# Patient Record
Sex: Female | Born: 1945 | Race: White | Hispanic: No | Marital: Married | Smoking: Never smoker
Health system: Southern US, Community
[De-identification: ages and names within clinical notes are randomized; demographics above are authoritative.]

## PROBLEM LIST (undated history)

## (undated) DIAGNOSIS — M810 Age-related osteoporosis without current pathological fracture: Secondary | ICD-10-CM

## (undated) DIAGNOSIS — D649 Anemia, unspecified: Secondary | ICD-10-CM

## (undated) DIAGNOSIS — G35D Multiple sclerosis, unspecified: Secondary | ICD-10-CM

## (undated) DIAGNOSIS — G35 Multiple sclerosis: Secondary | ICD-10-CM

## (undated) HISTORY — DX: Multiple sclerosis: G35

## (undated) HISTORY — DX: Anemia, unspecified: D64.9

## (undated) HISTORY — DX: Age-related osteoporosis without current pathological fracture: M81.0

## (undated) HISTORY — DX: Multiple sclerosis, unspecified: G35.D

---

## 1998-07-10 ENCOUNTER — Other Ambulatory Visit: Admission: RE | Admit: 1998-07-10 | Discharge: 1998-07-10 | Payer: Self-pay | Admitting: Obstetrics and Gynecology

## 2001-03-23 ENCOUNTER — Encounter: Admission: RE | Admit: 2001-03-23 | Discharge: 2001-03-23 | Payer: Self-pay | Admitting: Internal Medicine

## 2001-03-23 ENCOUNTER — Encounter: Payer: Self-pay | Admitting: Internal Medicine

## 2001-04-05 ENCOUNTER — Encounter: Admission: RE | Admit: 2001-04-05 | Discharge: 2001-04-05 | Payer: Self-pay | Admitting: Internal Medicine

## 2001-04-05 ENCOUNTER — Encounter: Payer: Self-pay | Admitting: Internal Medicine

## 2001-04-13 ENCOUNTER — Encounter (INDEPENDENT_AMBULATORY_CARE_PROVIDER_SITE_OTHER): Payer: Self-pay | Admitting: Specialist

## 2001-04-13 ENCOUNTER — Encounter: Payer: Self-pay | Admitting: Urology

## 2001-04-13 ENCOUNTER — Ambulatory Visit (HOSPITAL_COMMUNITY): Admission: RE | Admit: 2001-04-13 | Discharge: 2001-04-13 | Payer: Self-pay | Admitting: Urology

## 2001-08-30 ENCOUNTER — Encounter (INDEPENDENT_AMBULATORY_CARE_PROVIDER_SITE_OTHER): Payer: Self-pay | Admitting: Specialist

## 2001-08-30 ENCOUNTER — Ambulatory Visit (HOSPITAL_COMMUNITY): Admission: RE | Admit: 2001-08-30 | Discharge: 2001-08-30 | Payer: Self-pay | Admitting: Gastroenterology

## 2001-11-07 ENCOUNTER — Encounter: Admission: RE | Admit: 2001-11-07 | Discharge: 2001-11-07 | Payer: Self-pay | Admitting: Urology

## 2001-11-07 ENCOUNTER — Encounter: Payer: Self-pay | Admitting: Urology

## 2001-11-27 ENCOUNTER — Encounter: Payer: Self-pay | Admitting: Urology

## 2001-11-27 ENCOUNTER — Ambulatory Visit (HOSPITAL_COMMUNITY): Admission: RE | Admit: 2001-11-27 | Discharge: 2001-11-27 | Payer: Self-pay | Admitting: Urology

## 2004-09-18 ENCOUNTER — Ambulatory Visit (HOSPITAL_BASED_OUTPATIENT_CLINIC_OR_DEPARTMENT_OTHER): Admission: RE | Admit: 2004-09-18 | Discharge: 2004-09-18 | Payer: Self-pay | Admitting: *Deleted

## 2004-09-18 ENCOUNTER — Ambulatory Visit (HOSPITAL_COMMUNITY): Admission: RE | Admit: 2004-09-18 | Discharge: 2004-09-18 | Payer: Self-pay | Admitting: *Deleted

## 2004-09-18 ENCOUNTER — Encounter (INDEPENDENT_AMBULATORY_CARE_PROVIDER_SITE_OTHER): Payer: Self-pay | Admitting: *Deleted

## 2005-09-04 ENCOUNTER — Encounter: Admission: RE | Admit: 2005-09-04 | Discharge: 2005-09-04 | Payer: Self-pay | Admitting: Family Medicine

## 2006-04-04 ENCOUNTER — Encounter: Admission: RE | Admit: 2006-04-04 | Discharge: 2006-07-03 | Payer: Self-pay

## 2007-12-05 ENCOUNTER — Encounter: Admission: RE | Admit: 2007-12-05 | Discharge: 2008-02-01 | Payer: Self-pay | Admitting: Neurology

## 2008-02-05 ENCOUNTER — Encounter: Admission: RE | Admit: 2008-02-05 | Discharge: 2008-02-07 | Payer: Self-pay | Admitting: Neurology

## 2009-12-10 ENCOUNTER — Emergency Department (HOSPITAL_COMMUNITY): Admission: EM | Admit: 2009-12-10 | Discharge: 2009-12-10 | Payer: Self-pay | Admitting: Emergency Medicine

## 2009-12-18 ENCOUNTER — Encounter: Admission: RE | Admit: 2009-12-18 | Discharge: 2009-12-18 | Payer: Self-pay | Admitting: Internal Medicine

## 2009-12-23 ENCOUNTER — Encounter
Admission: RE | Admit: 2009-12-23 | Discharge: 2010-01-21 | Payer: Self-pay | Source: Home / Self Care | Attending: Neurology | Admitting: Neurology

## 2010-01-21 ENCOUNTER — Encounter: Admission: RE | Admit: 2010-01-21 | Payer: Self-pay | Source: Home / Self Care | Admitting: Neurology

## 2010-02-05 ENCOUNTER — Encounter
Admission: RE | Admit: 2010-02-05 | Discharge: 2010-02-26 | Payer: Self-pay | Source: Home / Self Care | Attending: Neurology | Admitting: Neurology

## 2010-02-10 ENCOUNTER — Encounter: Admit: 2010-02-10 | Payer: Self-pay | Admitting: Neurology

## 2010-04-14 LAB — BASIC METABOLIC PANEL
CO2: 25 mEq/L (ref 19–32)
Glucose, Bld: 101 mg/dL — ABNORMAL HIGH (ref 70–99)
Potassium: 4.2 mEq/L (ref 3.5–5.1)
Sodium: 139 mEq/L (ref 135–145)

## 2010-04-14 LAB — CBC
HCT: 32.7 % — ABNORMAL LOW (ref 36.0–46.0)
Hemoglobin: 11.4 g/dL — ABNORMAL LOW (ref 12.0–15.0)
MCH: 33.6 pg (ref 26.0–34.0)
MCHC: 34.8 g/dL (ref 30.0–36.0)
MCV: 96.5 fL (ref 78.0–100.0)

## 2010-06-19 NOTE — Op Note (Signed)
Sara Aguilar, Sara Aguilar              ACCOUNT NO.:  [id number]   MEDICAL RECORD NO.:  [id number]          PATIENT TYPE:  AMB   LOCATION:  [facility]                         FACILITY:  [facility]   PHYSICIAN:  Vikki Ports, MDDATE OF BIRTH:  1945-10-05   DATE OF PROCEDURE:  09/18/2004  DATE OF DISCHARGE:                                 OPERATIVE REPORT   PREOPERATIVE DIAGNOSIS:  Grade 3 internal hemorrhoids.   POSTOPERATIVE DIAGNOSIS:  Grade 3 internal hemorrhoids.   PROCEDURE:  Procedure for prolapsing hemorrhoids, rectopexy.   SURGEON:  Danna Hefty, MD.   DESCRIPTION:  The patient was taken to the operating room and placed in a  supine position.  After adequate general anesthesia was induced using  endotracheal tube, the patient was placed in the prone jack-knife position.  Perianal and rectal prep were undertaken.  Gentle anal dilatation was  accomplished.  The patient had significant prolapse of her hemorrhoids.  Hemorrhoidal bundles were injected with Marcaine and Wydase.  Internal and  external sphincter muscles were injected with 0.5 Marcaine.  A 2-0 Prolene  pursestring suture was placed in the mucosa and submucosa 5 cm proximal to  the dentate line.  Stapling device was inserted proximal to that and the  pursestring suture tied around it.  Stapler was closed, held in place for 1  minute, fired, and removed.  The staple line was tediously inspected and  found to have no bleeding.  Gelfoam packing was placed.  The patient  tolerated the procedure well, went to PACU in good condition.      Vikki Ports, MD  Electronically Signed     KRH/MEDQ  D:  09/18/2004  T:  09/18/2004  Job:  [id number]

## 2010-08-09 ENCOUNTER — Emergency Department (HOSPITAL_COMMUNITY): Payer: BC Managed Care – PPO

## 2010-08-09 ENCOUNTER — Emergency Department (HOSPITAL_COMMUNITY)
Admission: EM | Admit: 2010-08-09 | Discharge: 2010-08-10 | Disposition: A | Discharge status: Home / Self Care | Payer: BC Managed Care – PPO | Attending: Emergency Medicine | Admitting: Emergency Medicine

## 2010-08-09 ENCOUNTER — Encounter (HOSPITAL_COMMUNITY): Payer: Self-pay

## 2010-08-09 DIAGNOSIS — N390 Urinary tract infection, site not specified: Secondary | ICD-10-CM | POA: Insufficient documentation

## 2010-08-09 DIAGNOSIS — R10811 Right upper quadrant abdominal tenderness: Secondary | ICD-10-CM | POA: Insufficient documentation

## 2010-08-09 DIAGNOSIS — R300 Dysuria: Secondary | ICD-10-CM | POA: Insufficient documentation

## 2010-08-09 DIAGNOSIS — G35 Multiple sclerosis: Secondary | ICD-10-CM | POA: Insufficient documentation

## 2010-08-09 DIAGNOSIS — R110 Nausea: Secondary | ICD-10-CM | POA: Insufficient documentation

## 2010-08-09 DIAGNOSIS — N281 Cyst of kidney, acquired: Secondary | ICD-10-CM | POA: Insufficient documentation

## 2010-08-09 DIAGNOSIS — D320 Benign neoplasm of cerebral meninges: Secondary | ICD-10-CM | POA: Insufficient documentation

## 2010-08-09 DIAGNOSIS — R748 Abnormal levels of other serum enzymes: Secondary | ICD-10-CM | POA: Insufficient documentation

## 2010-08-09 DIAGNOSIS — R1032 Left lower quadrant pain: Secondary | ICD-10-CM | POA: Insufficient documentation

## 2010-08-09 LAB — CBC
HCT: 31.7 % — ABNORMAL LOW (ref 36.0–46.0)
MCHC: 33.4 g/dL (ref 30.0–36.0)
MCV: 96.6 fL (ref 78.0–100.0)
RDW: 12.2 % (ref 11.5–15.5)
WBC: 5.5 10*3/uL (ref 4.0–10.5)

## 2010-08-09 LAB — DIFFERENTIAL
Eosinophils Relative: 0 % (ref 0–5)
Lymphocytes Relative: 25 % (ref 12–46)
Lymphs Abs: 1.4 10*3/uL (ref 0.7–4.0)
Monocytes Absolute: 0.5 10*3/uL (ref 0.1–1.0)

## 2010-08-09 LAB — LIPASE, BLOOD: Lipase: 128 U/L — ABNORMAL HIGH (ref 11–59)

## 2010-08-09 LAB — COMPREHENSIVE METABOLIC PANEL
Albumin: 4 g/dL (ref 3.5–5.2)
BUN: 17 mg/dL (ref 6–23)
Chloride: 96 mEq/L (ref 96–112)
Creatinine, Ser: <0.47 mg/dL — ABNORMAL LOW (ref 0.50–1.10)
Total Bilirubin: 0.2 mg/dL — ABNORMAL LOW (ref 0.3–1.2)

## 2010-08-09 LAB — URINE MICROSCOPIC-ADD ON

## 2010-08-09 LAB — URINALYSIS, ROUTINE W REFLEX MICROSCOPIC
Ketones, ur: 15 mg/dL — AB
Nitrite: POSITIVE — AB
Protein, ur: NEGATIVE mg/dL
Urobilinogen, UA: 2 mg/dL — ABNORMAL HIGH (ref 0.0–1.0)
pH: 6 (ref 5.0–8.0)

## 2010-08-09 MED ORDER — IOHEXOL 300 MG/ML  SOLN
100.0000 mL | Freq: Once | INTRAMUSCULAR | Status: AC | PRN
  Administered 2010-08-09: 100 mL via INTRAVENOUS

## 2010-08-10 ENCOUNTER — Emergency Department (HOSPITAL_COMMUNITY): Payer: BC Managed Care – PPO

## 2010-08-10 LAB — WET PREP, GENITAL: Clue Cells Wet Prep HPF POC: NONE SEEN

## 2010-08-11 LAB — GC/CHLAMYDIA PROBE AMP, GENITAL
Chlamydia, DNA Probe: NEGATIVE
GC Probe Amp, Genital: NEGATIVE

## 2010-08-20 ENCOUNTER — Other Ambulatory Visit: Payer: Self-pay | Admitting: Obstetrics and Gynecology

## 2010-08-20 DIAGNOSIS — N6312 Unspecified lump in the right breast, upper inner quadrant: Secondary | ICD-10-CM

## 2010-08-26 ENCOUNTER — Other Ambulatory Visit: Payer: Self-pay | Admitting: Radiology

## 2010-08-26 DIAGNOSIS — T8543XA Leakage of breast prosthesis and implant, initial encounter: Secondary | ICD-10-CM

## 2010-08-28 ENCOUNTER — Other Ambulatory Visit: Payer: BC Managed Care – PPO

## 2010-09-10 ENCOUNTER — Other Ambulatory Visit: Payer: BC Managed Care – PPO

## 2010-09-10 ENCOUNTER — Ambulatory Visit
Admission: RE | Admit: 2010-09-10 | Discharge: 2010-09-10 | Disposition: A | Discharge status: Home / Self Care | Payer: BC Managed Care – PPO | Source: Ambulatory Visit | Attending: Radiology | Admitting: Radiology

## 2010-09-10 DIAGNOSIS — T8543XA Leakage of breast prosthesis and implant, initial encounter: Secondary | ICD-10-CM

## 2012-04-20 ENCOUNTER — Encounter: Payer: Self-pay | Admitting: *Deleted

## 2013-06-11 ENCOUNTER — Ambulatory Visit: Payer: PRIVATE HEALTH INSURANCE | Attending: Neurosurgery | Admitting: Physical Therapy

## 2013-06-11 DIAGNOSIS — R269 Unspecified abnormalities of gait and mobility: Secondary | ICD-10-CM | POA: Insufficient documentation

## 2013-06-11 DIAGNOSIS — M6281 Muscle weakness (generalized): Secondary | ICD-10-CM | POA: Insufficient documentation

## 2013-06-11 DIAGNOSIS — IMO0001 Reserved for inherently not codable concepts without codable children: Secondary | ICD-10-CM | POA: Insufficient documentation

## 2013-06-18 ENCOUNTER — Ambulatory Visit: Payer: PRIVATE HEALTH INSURANCE | Admitting: Physical Therapy

## 2013-06-21 ENCOUNTER — Ambulatory Visit: Payer: PRIVATE HEALTH INSURANCE | Admitting: Physical Therapy

## 2013-06-27 ENCOUNTER — Ambulatory Visit: Payer: PRIVATE HEALTH INSURANCE | Admitting: Physical Therapy

## 2013-07-03 ENCOUNTER — Ambulatory Visit: Payer: PRIVATE HEALTH INSURANCE | Attending: Neurosurgery | Admitting: Physical Therapy

## 2013-07-03 DIAGNOSIS — M6281 Muscle weakness (generalized): Secondary | ICD-10-CM | POA: Diagnosis not present

## 2013-07-03 DIAGNOSIS — R269 Unspecified abnormalities of gait and mobility: Secondary | ICD-10-CM | POA: Insufficient documentation

## 2013-07-03 DIAGNOSIS — IMO0001 Reserved for inherently not codable concepts without codable children: Secondary | ICD-10-CM | POA: Insufficient documentation

## 2013-07-05 ENCOUNTER — Ambulatory Visit: Payer: PRIVATE HEALTH INSURANCE | Admitting: Physical Therapy

## 2013-07-05 DIAGNOSIS — IMO0001 Reserved for inherently not codable concepts without codable children: Secondary | ICD-10-CM | POA: Diagnosis not present

## 2013-07-10 ENCOUNTER — Ambulatory Visit: Payer: PRIVATE HEALTH INSURANCE | Admitting: Physical Therapy

## 2013-07-10 DIAGNOSIS — IMO0001 Reserved for inherently not codable concepts without codable children: Secondary | ICD-10-CM | POA: Diagnosis not present

## 2013-07-12 ENCOUNTER — Ambulatory Visit: Payer: PRIVATE HEALTH INSURANCE | Admitting: Physical Therapy

## 2013-07-12 DIAGNOSIS — IMO0001 Reserved for inherently not codable concepts without codable children: Secondary | ICD-10-CM | POA: Diagnosis not present

## 2013-07-17 ENCOUNTER — Ambulatory Visit: Payer: PRIVATE HEALTH INSURANCE | Admitting: Physical Therapy

## 2013-07-17 DIAGNOSIS — IMO0001 Reserved for inherently not codable concepts without codable children: Secondary | ICD-10-CM | POA: Diagnosis not present

## 2013-07-19 ENCOUNTER — Ambulatory Visit: Payer: PRIVATE HEALTH INSURANCE | Admitting: Physical Therapy

## 2013-07-19 DIAGNOSIS — IMO0001 Reserved for inherently not codable concepts without codable children: Secondary | ICD-10-CM | POA: Diagnosis not present

## 2013-07-24 ENCOUNTER — Ambulatory Visit: Payer: PRIVATE HEALTH INSURANCE | Admitting: Physical Therapy

## 2013-07-24 DIAGNOSIS — IMO0001 Reserved for inherently not codable concepts without codable children: Secondary | ICD-10-CM | POA: Diagnosis not present

## 2013-07-27 ENCOUNTER — Ambulatory Visit: Payer: PRIVATE HEALTH INSURANCE | Admitting: Physical Therapy

## 2013-07-31 ENCOUNTER — Ambulatory Visit: Payer: PRIVATE HEALTH INSURANCE | Admitting: Physical Therapy

## 2013-07-31 DIAGNOSIS — IMO0001 Reserved for inherently not codable concepts without codable children: Secondary | ICD-10-CM | POA: Diagnosis not present

## 2013-08-02 ENCOUNTER — Ambulatory Visit: Payer: PRIVATE HEALTH INSURANCE | Admitting: Physical Therapy

## 2013-08-07 ENCOUNTER — Ambulatory Visit: Payer: PRIVATE HEALTH INSURANCE | Admitting: Physical Therapy

## 2013-08-09 ENCOUNTER — Ambulatory Visit: Payer: PRIVATE HEALTH INSURANCE | Admitting: Physical Therapy

## 2013-08-14 ENCOUNTER — Ambulatory Visit: Payer: PRIVATE HEALTH INSURANCE | Admitting: Physical Therapy

## 2014-04-16 ENCOUNTER — Ambulatory Visit: Payer: Medicare Other | Attending: Neurology

## 2014-04-16 DIAGNOSIS — R29898 Other symptoms and signs involving the musculoskeletal system: Secondary | ICD-10-CM | POA: Diagnosis not present

## 2014-04-16 DIAGNOSIS — R269 Unspecified abnormalities of gait and mobility: Secondary | ICD-10-CM | POA: Diagnosis present

## 2014-04-17 NOTE — Therapy (Signed)
[facility] [address] [place], [place], [address] Phone: [phone]   Fax:  [phone]  Physical Therapy Evaluation  Patient Details  Name: Sara Aguilar MRN: [id number] Date of Birth: 67-02-1945 Referring Provider:  Ala Bent, MD  Encounter Date: 04/16/2014      PT End of Session - 04/17/14 0817    Visit Number 1   Number of Visits 17   Date for PT Re-Evaluation 06/15/14   Authorization Type G-code every 10th visit.   PT Start Time 1316   PT Stop Time 1359   PT Time Calculation (min) 43 min   Equipment Utilized During Treatment Gait belt   Activity Tolerance Patient tolerated treatment well   Behavior During Therapy WFL for tasks assessed/performed      Past Medical History  Diagnosis Date  . Anemia   . Osteoporosis   . Multiple sclerosis     History reviewed. No pertinent past surgical history.  There were no vitals filed for this visit.  Visit Diagnosis:  Abnormality of gait - Plan: PT plan of care cert/re-cert  Weakness of right lower extremity - Plan: PT plan of care cert/re-cert      Subjective Assessment - 04/16/14 1323    Symptoms Impaired balance, difficulty walking, R LE weakness   Pertinent History MS, meningoma resection 2015   Patient Stated Goals Walk without fear of falling, go up steps or curb without handrail   Currently in Pain? No/denies            [facility] PT Assessment - 04/16/14 1328    Assessment   Medical Diagnosis MS   Onset Date 02/16/07  pt is not quite sure when she was diagnosed.   Prior Therapy OPPT neuro 07/2013   Precautions   Precautions Fall  BERG score indicates fall risk   Restrictions   Weight Bearing Restrictions No   Balance Screen   Has the patient fallen in the past 6 months No   Has the patient had a decrease in activity level because of a fear of falling?  Yes   Is the patient reluctant to leave their home because of a fear of falling?   No   Home Environment   Living Enviornment Private residence   Living Arrangements Alone   Available Help at Discharge Neighbor   Type of [place]  bedroom on second floor   [place] to enter   Entrance Stairs-Number of Steps 3   Entrance Stairs-Rails Can reach both   [place] Two level  with basement, freezer is downstairs    Alternate Level Stairs-Number of Steps two flight of steps  basement-R hand rail, upstairs on L side   Alternate Level Stairs-Rails Right   Home Equipment Walker - 4 wheels;Shower seat;Grab bars - tub/shower;Cane - single point  rollator as needed for longer distances   Prior Function   Level of Independence Requires assistive device for independence;Independent with basic ADLs;Independent with homemaking with ambulation;Independent with transfers   [employer] work   [employer] One day a week, kindergarten class, she Human resources officer.   Leisure Swims at [facility], walk on [employer], difficulty walking dog on the leash   Cognition   Overall Cognitive Status History of cognitive impairments - at baseline  per pt, memory has gotten worse since MS diagnosis.   Observation/Other Assessments   Focus on Therapeutic Outcomes (FOTO)  FOTO: Neuro QOL-LE: 34.0   Sensation   Light Touch Appears Intact  Additional Comments N/T in B feet (R>L), sometimes N/T travels proximally to knee.   Coordination   Gross Motor Movements are Fluid and Coordinated Yes   Fine Motor Movements are Fluid and Coordinated Yes   Posture/Postural Control   Posture/Postural Control No significant limitations   ROM / Strength   AROM / PROM / Strength AROM;Strength   AROM   Overall AROM  Within functional limits for tasks performed   Strength   Overall Strength Deficits   Overall Strength Comments L UE/LE WFL. R hip flex 2+/5, R knee flex 3+/5, R knee ext 4/5, R ankle dorsiflex 3/5. L LE WFL.   Transfers   Transfers Sit to Stand;Stand to Sit   Sit to Stand 5:  Supervision;With upper extremity assist;With armrests;From chair/3-in-1   Stand to Sit 5: Supervision;With upper extremity assist;With armrests;To chair/3-in-1   Ambulation/Gait   Ambulation/Gait Yes   Ambulation/Gait Assistance 4: Min guard   Ambulation/Gait Assistance Details Pt ambulated over even terrain, with increased postural sway noted during turns.   Ambulation Distance (Feet) 75 Feet   Assistive device None   Gait Pattern Step-through pattern;Decreased dorsiflexion - right;Decreased stride length;Decreased trunk rotation  Decreased hip rotation and decr. R UE swing   Ambulation Surface Level;Indoor   Gait velocity 2.66ft/sec.   Standardized Balance Assessment   Standardized Balance Assessment Berg Balance Test;Timed Up and Go Test   Berg Balance Test   Sit to Stand Able to stand without using hands and stabilize independently   Standing Unsupported Able to stand 30 seconds unsupported   Sitting with Back Unsupported but Feet Supported on Floor or Stool Able to sit safely and securely 2 minutes   Stand to Sit Sits safely with minimal use of hands   Transfers Able to transfer safely, definite need of hands   Standing Unsupported with Eyes Closed Needs help to keep from falling   Standing Ubsupported with Feet Together Needs help to attain position but able to stand for 30 seconds with feet together   From Standing, Reach Forward with Outstretched Arm Can reach forward >5 cm safely (2")   From Standing Position, Pick up Object from Floor Able to pick up shoe, needs supervision   From Standing Position, Turn to Look Behind Over each Shoulder Looks behind one side only/other side shows less weight shift   Turn 360 Degrees Needs close supervision or verbal cueing   Standing Unsupported, Alternately Place Feet on Step/Stool Needs assistance to keep from falling or unable to try   Standing Unsupported, One Foot in Front Able to plae foot ahead of the other independently and hold 30  seconds   Standing on One Leg Tries to lift leg/unable to hold 3 seconds but remains standing independently   Total Score 31   Timed Up and Go Test   TUG Normal TUG   Normal TUG (seconds) 18.54  no AD                             PT Short Term Goals - 04/17/14 1540    PT SHORT TERM GOAL #1   Title Pt will be independent in HEP to improve functional mobility. Target date: 05/14/14.   Status New   PT SHORT TERM GOAL #2   Title Pt will improve BERG score to >/=35/56 to decrease falls risk. Target date 05/14/14.   Status New   PT SHORT TERM GOAL #3   Title Pt will ambulate  300' over even/uneven terrain with LRAD and supervision to improve functional mobility. Target date: 05/14/14.   Status New   PT SHORT TERM GOAL #4   Title Pt will traverse 12 steps without handrail with min guard to improve functional mobility. Target date: 05/14/14.   Status New   PT SHORT TERM GOAL #5   Title Pt will perform TUG with LRAD in </=13.5 seconds to decrease falls risk. Target date: 05/14/14.   Status New           PT Long Term Goals - 05/12/14 8937    PT LONG TERM GOAL #1   Title Pt will verbalize understanding of falls prevention handout to decrease falls risk. Target date: 05/14/14.   Status New   PT LONG TERM GOAL #2   Title Pt will improve BERG score to >/=39/56 to decrease falls risk. Target date: 05/14/14.   Status New   PT LONG TERM GOAL #3   Title Pt will ambulate 600' over even/uneven terrain (including sand) with LRAD at MOD I level to improve functional mobility. Target date: 05/14/14.   Status New   PT LONG TERM GOAL #4   Title Pt will traverse 12 steps and curb without handrail, independently, in order to improve functional mobility and to traverse stairs at home. Target date: 05/14/14.   Status New   PT LONG TERM GOAL #5   Title Pt will improve NeuroQOL-LE to >/=43 to improve quality of life. Target date: 05/14/14.   Status New   Additional Long Term Goals    Additional Long Term Goals Yes   PT LONG TERM GOAL #6   Title Pt will ambulate 200' while receiving external perturbations without LOB, with supervision, in order to walk her dog without falling. Target date: 05/14/14.   Status New               Plan - 04/16/14 1325    Clinical Impression Statement Pt is a pleasant 68y/o female presenting to OPPT neuro with impaired balance and history of MS. Pt had OPPT neuro 07/2013 for impaired balance and reported she was doing great after PT, however, she has had a decline due to illness this winter and bad weather. Pt denied falls in the last six months. Pt reported she sits down to bathe and dress due to impaired balance, and that she uses rollator to amb. long distances as needed. Pt reported she has difficulty sequencing with SPC. Pt's BERG score of 31/56 and TUG time of 18.54 indicate she is at a high risk for falls.    Pt will benefit from skilled therapeutic intervention in order to improve on the following deficits Abnormal gait;Decreased endurance;Decreased knowledge of use of DME;Decreased balance;Decreased strength;Decreased mobility   Rehab Potential Good   PT Frequency 2x / week   PT Duration 8 weeks   PT Treatment/Interventions ADLs/Self Care Home Management;Gait training;Neuromuscular re-education;Stair training;Biofeedback;Functional mobility training;Patient/family education;Cryotherapy;Therapeutic activities;Electrical Stimulation;Therapeutic exercise;Manual techniques;DME Instruction;Balance training   PT Next Visit Plan Initiate balance/strength HEP and trial gait training with AD.   Consulted and Agree with Plan of Care Patient          G-Codes - May 12, 2014 0829    Functional Assessment Tool Used BERG: 31/56; gait speed without AD:2.49ft/sec; TUG without AD: 18.54sec.   Functional Limitation Mobility: Walking and moving around   Mobility: Walking and Moving Around Current Status [phone] At least 40 percent but less than 60 percent  impaired, limited or restricted   Mobility: Walking and Moving  Around Goal Status [phone]) At least 1 percent but less than 20 percent impaired, limited or restricted       Problem List There are no active problems to display for this patient.   Campbell Kray L 04/17/2014, 8:31 AM  [facility] [address] [place], [place], [address] Phone: [phone]   Fax:  [phone]    Geoffry Paradise, PT,DPT 04/17/2014 8:31 AM Phone: [phone] Fax: [phone]

## 2014-04-23 ENCOUNTER — Ambulatory Visit: Payer: Medicare Other | Admitting: Physical Therapy

## 2014-04-23 ENCOUNTER — Encounter: Payer: Self-pay | Admitting: Physical Therapy

## 2014-04-23 DIAGNOSIS — R29898 Other symptoms and signs involving the musculoskeletal system: Secondary | ICD-10-CM

## 2014-04-23 DIAGNOSIS — R269 Unspecified abnormalities of gait and mobility: Secondary | ICD-10-CM

## 2014-04-24 NOTE — Therapy (Signed)
[facility] [address] [place], [place], [address] Phone: [phone]   Fax:  [phone]  Physical Therapy Treatment  Patient Details  Name: Sara Aguilar MRN: [id number] Date of Birth: 17-Feb-1945 Referring Provider:  Ala Bent, MD  Encounter Date: 04/23/2014      PT End of Session - 04/24/14 1334    Visit Number 2   Number of Visits 17   Date for PT Re-Evaluation 06/15/14   Authorization Type G-code every 10th visit.   PT Start Time 1317   PT Stop Time 1404   PT Time Calculation (min) 47 min   Activity Tolerance Patient tolerated treatment well   Behavior During Therapy WFL for tasks assessed/performed      Past Medical History  Diagnosis Date  . Anemia   . Osteoporosis   . Multiple sclerosis     History reviewed. No pertinent past surgical history.  There were no vitals filed for this visit.  Visit Diagnosis:  Abnormality of gait  Weakness of right lower extremity      Subjective Assessment - 04/23/14 1323    Symptoms Denies falls since last visit   Pertinent History MS, meningoma resection 2015   Currently in Pain? Yes   Pain Score 0-No pain  pain in R hip at times to 6/10   Pain Location Hip   Pain Orientation Right   Pain Type Acute pain                       [facility] Adult PT Treatment/Exercise - 04/24/14 0001    Transfers   Transfers Sit to Stand;Stand to Sit   Sit to Stand 5: Supervision;With upper extremity assist;With armrests;From chair/3-in-1   Stand to Sit 5: Supervision;With upper extremity assist;With armrests;To chair/3-in-1   Ambulation/Gait   Ambulation/Gait Yes   Ambulation/Gait Assistance 4: Min guard;4: Min assist   Ambulation/Gait Assistance Details Trialed R toe off brace, R [place] (both types) as well as added 1/4' heel wedge.  Pt continues with R knee hyperextension and R hip circumduction increases with braces.  Pt was able to clear R foot  better with no catching.  Pt does not like the stiffness of any of the braces and needed min assist to ambulate secondary to unsteadiness.   Ambulation Distance (Feet) 110 Feet  three times, 100 once, 50 three times   Assistive device None   Gait Pattern Step-to pattern;Step-through pattern;Decreased stride length;Right genu recurvatum;Decreased trunk rotation;Decreased dorsiflexion - right   Ambulation Surface Level;Indoor   Ankle Exercises: Seated   Other Seated Ankle Exercises R LE seated dorsiflexion with red theraband x 10 x 2.                PT Education - 04/24/14 1333    Education provided Yes   Education Details strengthening vs bracing RLE, gait deviations   Person(s) Educated Patient   Methods Explanation   Comprehension Verbalized understanding          PT Short Term Goals - 04/17/14 6144    PT SHORT TERM GOAL #1   Title Pt will be independent in HEP to improve functional mobility. Target date: 05/14/14.   Status New   PT SHORT TERM GOAL #2   Title Pt will improve BERG score to >/=35/56 to decrease falls risk. Target date 05/14/14.   Status New   PT SHORT TERM GOAL #3   Title Pt will ambulate 300' over even/uneven terrain with LRAD and  supervision to improve functional mobility. Target date: 05/14/14.   Status New   PT SHORT TERM GOAL #4   Title Pt will traverse 12 steps without handrail with min guard to improve functional mobility. Target date: 05/14/14.   Status New   PT SHORT TERM GOAL #5   Title Pt will perform TUG with LRAD in </=13.5 seconds to decrease falls risk. Target date: 05/14/14.   Status New           PT Long Term Goals - 04/17/14 7989    PT LONG TERM GOAL #1   Title Pt will verbalize understanding of falls prevention handout to decrease falls risk. Target date: 05/14/14.   Status New   PT LONG TERM GOAL #2   Title Pt will improve BERG score to >/=39/56 to decrease falls risk. Target date: 05/14/14.   Status New   PT LONG TERM GOAL #3    Title Pt will ambulate 600' over even/uneven terrain (including sand) with LRAD at MOD I level to improve functional mobility. Target date: 05/14/14.   Status New   PT LONG TERM GOAL #4   Title Pt will traverse 12 steps and curb without handrail, independently, in order to improve functional mobility and to traverse stairs at home. Target date: 05/14/14.   Status New   PT LONG TERM GOAL #5   Title Pt will improve NeuroQOL-LE to >/=43 to improve quality of life. Target date: 05/14/14.   Status New   Additional Long Term Goals   Additional Long Term Goals Yes   PT LONG TERM GOAL #6   Title Pt will ambulate 200' while receiving external perturbations without LOB, with supervision, in order to walk her dog without falling. Target date: 05/14/14.   Status New               Plan - 04/24/14 1334    Clinical Impression Statement Pt continued with gait deviations with R AFO's trialed today.  May benefit from foot up brace but doubt pt could attach tight enough without assist.  Discussed strengthening to improve gait deviations and continue to assess for orthotic if needed.  Continue PT per POC.   Pt will benefit from skilled therapeutic intervention in order to improve on the following deficits Abnormal gait;Decreased endurance;Decreased knowledge of use of DME;Decreased balance;Decreased strength;Decreased mobility   Rehab Potential Good   PT Frequency 2x / week   PT Duration 8 weeks   PT Treatment/Interventions ADLs/Self Care Home Management;Gait training;Neuromuscular re-education;Stair training;Biofeedback;Functional mobility training;Patient/family education;Cryotherapy;Therapeutic activities;Electrical Stimulation;Therapeutic exercise;Manual techniques;DME Instruction;Balance training   PT Next Visit Plan Provide handout of R ankle dorsiflexion with red theraband.  R LE strengthening exercises and balance.   Consulted and Agree with Plan of Care Patient        Problem List There are no  active problems to display for this patient.   Narda Bonds 04/24/2014, 1:37 PM  [facility] [address] [place], [place], [address] Phone: [phone]   Fax:  [place], [place] 04/24/2014 1:38 PM Phone: [phone] Fax: [phone]

## 2014-04-25 ENCOUNTER — Encounter: Payer: Self-pay | Admitting: Physical Therapy

## 2014-04-25 ENCOUNTER — Ambulatory Visit: Payer: Medicare Other | Admitting: Physical Therapy

## 2014-04-25 DIAGNOSIS — R29898 Other symptoms and signs involving the musculoskeletal system: Secondary | ICD-10-CM

## 2014-04-25 DIAGNOSIS — R269 Unspecified abnormalities of gait and mobility: Secondary | ICD-10-CM | POA: Diagnosis not present

## 2014-04-25 NOTE — Patient Instructions (Signed)
Bridging   Slowly raise buttocks from floor, keeping stomach tight. Repeat 15 times per set. Do 1 sets per session. Do 1 sessions per day.  http://orth.exer.us/1097   Copyright  VHI. All rights reserved.  Bracing With Bridging (Hook-Lying)   With neutral spine, tighten pelvic floor and abdominals and hold. Lift bottom. Let legs move out to the side then pull back in together.  Lower bottom.  Repeat 15 times. Do 1 times a day.   Copyright  VHI. All rights reserved.  Bracing With March in Bridging (Hook-Lying)   With neutral spine, tighten pelvic floor and abdominals and hold. Lift bottom and hold, then march in place. Lower bottom back to the mat.  Do 10 times.  Do 1 times a day.   Copyright  VHI. All rights reserved.  Bracing With Leg Extension in Bridging (Hook-Lying)   With neutral spine, tighten pelvic floor and abdominals and hold. Lift bottom and hold, straighten one leg, hips still. Reverse sequence. Repeat with other leg. Repeat ___ times. Do ___ times a day.   Copyright  VHI. All rights reserved.  Hip Abduction / Adduction: with Knee Flexion (Supine)   With right knee bent with red band around knees, gently lower right knee to side and return. Repeat 15 times per set. Do 1 sets per session. Do 1 sessions per day.  http://orth.exer.us/683   Copyright  VHI. All rights reserved.  Abduction: Clam (Eccentric) - Side-Lying   Lie on left side with knees bent. Lift top knee, keeping feet together. Keep trunk steady. Slowly lower for 3-5 seconds. 15 reps per set, 1 sets per day.  Copyright  VHI. All rights reserved.  Abduction: Side Leg Lift (Eccentric) - Side-Lying   Lie on right side. Lift top leg slightly higher than shoulder level. Keep top leg straight with body, toes pointing forward. Slowly lower for 3-5 seconds. 15 reps per set, 1 sets per day.   Copyright  VHI. All rights reserved.

## 2014-04-25 NOTE — Therapy (Signed)
[facility] [address] [place], [place], [address] Phone: [phone]   Fax:  [phone]  Physical Therapy Treatment  Patient Details  Name: Sara Aguilar MRN: [id number] Date of Birth: 03/04/1945 Referring Provider:  Ala Bent, MD  Encounter Date: 04/25/2014      PT End of Session - 04/25/14 2228    Visit Number 3   Number of Visits 17   Date for PT Re-Evaluation 06/15/14   Authorization Type G-code every 10th visit.   PT Start Time 1105   PT Stop Time 1148   PT Time Calculation (min) 43 min   Activity Tolerance Patient tolerated treatment well   Behavior During Therapy WFL for tasks assessed/performed      Past Medical History  Diagnosis Date  . Anemia   . Osteoporosis   . Multiple sclerosis     History reviewed. No pertinent past surgical history.  There were no vitals filed for this visit.  Visit Diagnosis:  Weakness of right lower extremity      Subjective Assessment - 04/25/14 1106    Symptoms "I'm trying to not let my foot make noise"  re: RLE foot slap   Pertinent History MS, meningoma resection 2015   Patient Stated Goals Walk without fear of falling, go up steps or curb without handrail   Currently in Pain? Yes   Pain Score 0-No pain  5/10 with activity   Pain Location Hip   Pain Orientation Right   Pain Type Acute pain   Multiple Pain Sites No                       [facility] Adult PT Treatment/Exercise - 04/25/14 0001    Lumbar Exercises: Quadruped   Single Arm Raise 10 reps;5 seconds   Straight Leg Raise 10 reps;3 seconds      Treatment consisted of instruction and performance of exercises provided as part of HEP. Also performed supine bridges with LE on red therapy ball and hip flexion/extension with LE on red therapy ball.          PT Education - 04/25/14 2228    Education provided Yes   Education Details HEP   Person(s) Educated Patient   Methods  Explanation;Demonstration;Handout   Comprehension Verbalized understanding;Need further instruction          PT Short Term Goals - 04/17/14 0821    PT SHORT TERM GOAL #1   Title Pt will be independent in HEP to improve functional mobility. Target date: 05/14/14.   Status New   PT SHORT TERM GOAL #2   Title Pt will improve BERG score to >/=35/56 to decrease falls risk. Target date 05/14/14.   Status New   PT SHORT TERM GOAL #3   Title Pt will ambulate 300' over even/uneven terrain with LRAD and supervision to improve functional mobility. Target date: 05/14/14.   Status New   PT SHORT TERM GOAL #4   Title Pt will traverse 12 steps without handrail with min guard to improve functional mobility. Target date: 05/14/14.   Status New   PT SHORT TERM GOAL #5   Title Pt will perform TUG with LRAD in </=13.5 seconds to decrease falls risk. Target date: 05/14/14.   Status New           PT Long Term Goals - 04/17/14 2440    PT LONG TERM GOAL #1   Title Pt will verbalize understanding of falls prevention handout to  decrease falls risk. Target date: 05/14/14.   Status New   PT LONG TERM GOAL #2   Title Pt will improve BERG score to >/=39/56 to decrease falls risk. Target date: 05/14/14.   Status New   PT LONG TERM GOAL #3   Title Pt will ambulate 600' over even/uneven terrain (including sand) with LRAD at MOD I level to improve functional mobility. Target date: 05/14/14.   Status New   PT LONG TERM GOAL #4   Title Pt will traverse 12 steps and curb without handrail, independently, in order to improve functional mobility and to traverse stairs at home. Target date: 05/14/14.   Status New   PT LONG TERM GOAL #5   Title Pt will improve NeuroQOL-LE to >/=43 to improve quality of life. Target date: 05/14/14.   Status New   Additional Long Term Goals   Additional Long Term Goals Yes   PT LONG TERM GOAL #6   Title Pt will ambulate 200' while receiving external perturbations without LOB, with  supervision, in order to walk her dog without falling. Target date: 05/14/14.   Status New               Plan - 04/25/14 2229    Clinical Impression Statement Pt with trendelenburg gait pattern secondary to R hip weakness.  Continue PT per POC.   Pt will benefit from skilled therapeutic intervention in order to improve on the following deficits Abnormal gait;Decreased endurance;Decreased knowledge of use of DME;Decreased balance;Decreased strength;Decreased mobility   Rehab Potential Good   PT Frequency 2x / week   PT Duration 8 weeks   PT Treatment/Interventions ADLs/Self Care Home Management;Gait training;Neuromuscular re-education;Stair training;Biofeedback;Functional mobility training;Patient/family education;Cryotherapy;Therapeutic activities;Electrical Stimulation;Therapeutic exercise;Manual techniques;DME Instruction;Balance training   PT Next Visit Plan Review HEP.  Provide handout of R ankle dorsiflexion with red theraband.  Trunk control (quadriped, seated ball)   Consulted and Agree with Plan of Care Patient        Problem List There are no active problems to display for this patient.   Sara Aguilar 04/25/2014, 10:31 PM  [facility] [address] [place], [place], [address] Phone: [phone]   Fax:  [place], [place] 04/25/2014 10:32 PM Phone: [phone] Fax: [phone]

## 2014-04-30 ENCOUNTER — Ambulatory Visit: Payer: Medicare Other | Admitting: Physical Therapy

## 2014-04-30 ENCOUNTER — Encounter: Payer: Self-pay | Admitting: Physical Therapy

## 2014-04-30 DIAGNOSIS — R29898 Other symptoms and signs involving the musculoskeletal system: Secondary | ICD-10-CM

## 2014-04-30 DIAGNOSIS — R269 Unspecified abnormalities of gait and mobility: Secondary | ICD-10-CM | POA: Diagnosis not present

## 2014-04-30 NOTE — Patient Instructions (Signed)
ANKLE: Dorsiflexion (Band)   Sit at edge of surface. Place band around top of foot. Keeping heel on floor, raise toes of banded foot. Hold 5 seconds. Use red band. 15 reps per set, 3 sets per day.  Copyright  VHI. All rights reserved.

## 2014-04-30 NOTE — Therapy (Signed)
[facility] [address] [place], [place], [address] Phone: [phone]   Fax:  [phone]  Physical Therapy Treatment  Patient Details  Name: Sara Aguilar MRN: [id number] Date of Birth: 1945/06/24 Referring Provider:  Ala Bent, MD  Encounter Date: 04/30/2014      PT End of Session - 04/30/14 1636    Visit Number 4   Number of Visits 17   Date for PT Re-Evaluation 06/15/14   Authorization Type G-code every 10th visit.   PT Start Time 1317   PT Stop Time 1401   PT Time Calculation (min) 44 min   Equipment Utilized During Treatment Gait belt   Activity Tolerance Patient tolerated treatment well   Behavior During Therapy WFL for tasks assessed/performed      Past Medical History  Diagnosis Date  . Anemia   . Osteoporosis   . Multiple sclerosis     History reviewed. No pertinent past surgical history.  There were no vitals filed for this visit.  Visit Diagnosis:  Weakness of right lower extremity      Subjective Assessment - 04/30/14 1320    Symptoms Denies falls or changes since last visit.   Pertinent History MS, meningoma resection 2015   Patient Stated Goals Walk without fear of falling, go up steps or curb without handrail   Currently in Pain? No/denies                       [facility] Adult PT Treatment/Exercise - 04/30/14 0001           Lumbar Exercises: Quadruped   Single Arm Raise 10 reps;5 seconds   Straight Leg Raise 10 reps;3 seconds   Knee/Hip Exercises: Standing   Lateral Step Up Both;10 reps;Hand Hold: 2;Step Height: 6"   Forward Step Up Both;10 reps;Hand Hold: 2;Step Height: 6"   Functional Squat 10 reps;3 seconds  on BOSU in parallel bars with intermittent UE support   Other Standing Knee Exercises standing on black platform of BOSU in parallel bars with intermittent support   Other Standing Knee Exercises bil hip extension, abduction x 10    Knee/Hip Exercises:  Supine   Bridges Both;10 reps   Other Supine Knee Exercises bridge with march x 10, bridge with SLR on R x 10, hooklying hip abduction with red theraband x 10   Knee/Hip Exercises: Sidelying   Hip ABduction Right;10 reps   Clams R LE x 10   Other Sidelying Knee Exercises R LE hip extension x 10                    Other Seated Exercises Seated on therapy ball for core strengthening exercises   Other Seated Exercises Comments performed forward/backward, side/side, bouncing,marching, LAQ with intermittent UE support             PT Short Term Goals - 04/17/14 4818    PT SHORT TERM GOAL #1   Title Pt will be independent in HEP to improve functional mobility. Target date: 05/14/14.   Status New   PT SHORT TERM GOAL #2   Title Pt will improve BERG score to >/=35/56 to decrease falls risk. Target date 05/14/14.   Status New   PT SHORT TERM GOAL #3   Title Pt will ambulate 300' over even/uneven terrain with LRAD and supervision to improve functional mobility. Target date: 05/14/14.   Status New   PT SHORT TERM GOAL #4   Title Pt  will traverse 12 steps without handrail with min guard to improve functional mobility. Target date: 05/14/14.   Status New   PT SHORT TERM GOAL #5   Title Pt will perform TUG with LRAD in </=13.5 seconds to decrease falls risk. Target date: 05/14/14.   Status New           PT Long Term Goals - 04/17/14 2841    PT LONG TERM GOAL #1   Title Pt will verbalize understanding of falls prevention handout to decrease falls risk. Target date: 05/14/14.   Status New   PT LONG TERM GOAL #2   Title Pt will improve BERG score to >/=39/56 to decrease falls risk. Target date: 05/14/14.   Status New   PT LONG TERM GOAL #3   Title Pt will ambulate 600' over even/uneven terrain (including sand) with LRAD at MOD I level to improve functional mobility. Target date: 05/14/14.   Status New   PT LONG TERM GOAL #4   Title Pt will traverse 12 steps and curb without  handrail, independently, in order to improve functional mobility and to traverse stairs at home. Target date: 05/14/14.   Status New   PT LONG TERM GOAL #5   Title Pt will improve NeuroQOL-LE to >/=43 to improve quality of life. Target date: 05/14/14.   Status New   Additional Long Term Goals   Additional Long Term Goals Yes   PT LONG TERM GOAL #6   Title Pt will ambulate 200' while receiving external perturbations without LOB, with supervision, in order to walk her dog without falling. Target date: 05/14/14.   Status New               Plan - 04/30/14 1636    Clinical Impression Statement R hip strength improving as evident by less trendelenburg gait pattern.  Motivated to improve mobility.   Pt will benefit from skilled therapeutic intervention in order to improve on the following deficits Abnormal gait;Decreased endurance;Decreased knowledge of use of DME;Decreased balance;Decreased strength;Decreased mobility   Rehab Potential Good   PT Frequency 2x / week   PT Duration 8 weeks   PT Treatment/Interventions ADLs/Self Care Home Management;Gait training;Neuromuscular re-education;Stair training;Biofeedback;Functional mobility training;Patient/family education;Cryotherapy;Therapeutic activities;Electrical Stimulation;Therapeutic exercise;Manual techniques;DME Instruction;Balance training   PT Next Visit Plan Continue RLE strengthening and core strengthening.   Consulted and Agree with Plan of Care Patient        Problem List There are no active problems to display for this patient.   Narda Bonds 04/30/2014, 4:39 PM  [facility] [address] [place], [place], [address] Phone: [phone]   Fax:  [place], [place] 04/30/2014 4:39 PM Phone: [phone] Fax: [phone]

## 2014-05-01 ENCOUNTER — Ambulatory Visit: Payer: Medicare Other

## 2014-05-01 DIAGNOSIS — R269 Unspecified abnormalities of gait and mobility: Secondary | ICD-10-CM | POA: Diagnosis not present

## 2014-05-01 DIAGNOSIS — R29898 Other symptoms and signs involving the musculoskeletal system: Secondary | ICD-10-CM

## 2014-05-01 NOTE — Therapy (Signed)
[facility] [address] [place], [place], [address] Phone: [phone]   Fax:  [phone]  Physical Therapy Treatment  Patient Details  Name: Sara Aguilar MRN: [id number] Date of Birth: 10-Jun-1945 Referring Provider:  Ala Bent, MD  Encounter Date: 05/01/2014      PT End of Session - 05/01/14 1405    Visit Number 5   Number of Visits 17   Date for PT Re-Evaluation 06/15/14   Authorization Type G-code every 10th visit.   PT Start Time 1018   PT Stop Time 1059   PT Time Calculation (min) 41 min   Equipment Utilized During Treatment Gait belt   Activity Tolerance Patient tolerated treatment well   Behavior During Therapy WFL for tasks assessed/performed      Past Medical History  Diagnosis Date  . Anemia   . Osteoporosis   . Multiple sclerosis     History reviewed. No pertinent past surgical history.  There were no vitals filed for this visit.  Visit Diagnosis:  Abnormality of gait  Weakness of right lower extremity      Subjective Assessment - 05/01/14 1021    Symptoms Pt denied falls or changes since last visit.  Pt reported that she was given balance exercises when first diagnosed with MS, eight years ago, and exercises did not help balance.   Pertinent History MS, meningoma resection 2015   Patient Stated Goals Walk without fear of falling, go up steps or curb without handrail   Currently in Pain? Yes   Pain Score --  0/10 at rest and 5/10 during amb.   Pain Location Hip   Pain Orientation Right   Pain Descriptors / Indicators Sharp   Pain Type Chronic pain   Pain Onset More than a month ago   Pain Frequency Intermittent   Aggravating Factors  walking   Pain Relieving Factors rest and strengthening/swimming at [facility]       Therex: -Supine posterior pelvic tilts and TrA contraction with 5 second hold x5. VC;s and tactile cues for technique. Constance Haw with TrA contraction x15. VC's for  technique. -B hip marches with TrA contraction x10. VC's for technique.  Neuro re-ed: -Performed in corner with chair in front of pt for safety; B LEs with 2-3 sets and 10-30 second holds oven non-compliant surface with no UE support. Pt experienced 4 LOB episodes and required supervision to min guard for safety. Feet apart (FA)/feet together (FT) with eyes open/closed, FA with head turns, modified tandem stance, single leg stance with 2 UE support. Cues for technique. Pt required rest break after balance activities 2/2 fatigue.                        PT Education - 05/01/14 1405    Education provided Yes   Education Details Balance HEP and core stability HEP. PT educated pt on the importance of performing balance HEP, as strengthening and balance activities will improve balance.   Person(s) Educated Patient   Methods Explanation;Demonstration;Tactile cues;Verbal cues;Handout   Comprehension Verbalized understanding;Returned demonstration          PT Short Term Goals - 05/01/14 1408    PT SHORT TERM GOAL #1   Title Pt will be independent in HEP to improve functional mobility. Target date: 05/14/14.   Status On-going   PT SHORT TERM GOAL #2   Title Pt will improve BERG score to >/=35/56 to decrease falls risk. Target date 05/14/14.  Status On-going   PT SHORT TERM GOAL #3   Title Pt will ambulate 300' over even/uneven terrain with LRAD and supervision to improve functional mobility. Target date: 05/14/14.   Status On-going   PT SHORT TERM GOAL #4   Title Pt will traverse 12 steps without handrail with min guard to improve functional mobility. Target date: 05/14/14.   Status On-going   PT SHORT TERM GOAL #5   Title Pt will perform TUG with LRAD in </=13.5 seconds to decrease falls risk. Target date: 05/14/14.   Status On-going           PT Long Term Goals - 05/01/14 1408    PT LONG TERM GOAL #1   Title Pt will verbalize understanding of falls prevention handout  to decrease falls risk. Target date: 06/11/14.   Status On-going   PT LONG TERM GOAL #2   Title Pt will improve BERG score to >/=39/56 to decrease falls risk. Target date: 06/11/14.   Status On-going   PT LONG TERM GOAL #3   Title Pt will ambulate 600' over even/uneven terrain (including sand) with LRAD at MOD I level to improve functional mobility. Target date: 06/11/14.   Status On-going   PT LONG TERM GOAL #4   Title Pt will traverse 12 steps and curb without handrail, independently, in order to improve functional mobility and to traverse stairs at home. Target date: 06/11/14.   Status On-going   PT LONG TERM GOAL #5   Title Pt will improve NeuroQOL-LE to >/=43 to improve quality of life. Target date: 06/11/14.   Status On-going   PT LONG TERM GOAL #6   Title Pt will ambulate 200' while receiving external perturbations without LOB, with supervision, in order to walk her dog without falling. Target date: 06/11/14.   Status On-going               Plan - 05/01/14 1406    Clinical Impression Statement Pt demonstrated progress as she was able to progress strengthening HEP to incorporating TrA muscle contraction to improve core stablity. Pt experienced LOB and increased postural sway during static balance exercises, especially with eyes closed, indicating hypo functioning vestibular system. Pt would continue to benefit from skilled PT to improve safety during functional mobility.   Pt will benefit from skilled therapeutic intervention in order to improve on the following deficits Abnormal gait;Decreased endurance;Decreased knowledge of use of DME;Decreased balance;Decreased strength;Decreased mobility   Rehab Potential Good   PT Frequency 2x / week   PT Duration 8 weeks   PT Treatment/Interventions ADLs/Self Care Home Management;Gait training;Neuromuscular re-education;Stair training;Biofeedback;Functional mobility training;Patient/family education;Cryotherapy;Therapeutic  activities;Electrical Stimulation;Therapeutic exercise;Manual techniques;DME Instruction;Balance training   PT Next Visit Plan Continue RLE strengthening and core strengthening, dynamic gait training.   Consulted and Agree with Plan of Care Patient        Problem List There are no active problems to display for this patient.   Toma Erichsen L 05/01/2014, 2:10 PM  [facility] [address] [place], [place], [address] Phone: [phone]   Fax:  [phone]     Geoffry Paradise, PT,DPT 05/01/2014 2:10 PM Phone: [phone] Fax: [phone]

## 2014-05-01 NOTE — Patient Instructions (Signed)
**  Perform all exercises in a corner with a chair in front of you for safety:  Feet Apart, Head Motion - Eyes Open   With eyes open, feet apart, move head slowly: up and down and side to side for 30 seconds. Repeat __3__ times per session. Do __1__ sessions per day.  Copyright  VHI. All rights reserved.  Feet Apart, Varied Arm Positions - Eyes Closed   Stand with feet shoulder width apart and arms at your side. Close eyes and visualize upright position. Hold _10-203___ seconds. Repeat __3__ times per session. Do __1__ sessions per day.  Copyright  VHI. All rights reserved.  Feet Together, Varied Arm Positions - Eyes Open   With eyes open, feet together, arms at your side, look straight ahead at a stationary object. Hold _10-30___ seconds. Repeat __3__ times per session. Do __1__ sessions per day.  Copyright  VHI. All rights reserved.    Feet Partial Heel-Toe, Varied Arm Positions - Eyes Open   With eyes open, right foot partially in front of the other, arms at your side, look straight ahead at a stationary object. Repeat with left foot in front of you. Hold __10-20__ seconds. Repeat _3___ times per session. Do __1__ sessions per day.  Copyright  VHI. All rights reserved.  Single Leg - Eyes Open   Holding support, lift right leg while maintaining balance over other leg. Progress to removing hands from support surface for longer periods of time. Repeat with left leg lifted. Hold__10__ seconds. Repeat __3__ times per session. Do __1__ sessions per day.  Copyright  VHI. All rights reserved.    **DURING BRIDGES AND HIP MARCHES WHILE LYING DOWN, MAKE SURE TO TUCK IN STOMACH (BELLY BUTTON TOWARDS SPINE).**

## 2014-05-07 ENCOUNTER — Ambulatory Visit: Payer: Medicare Other | Attending: Neurology | Admitting: Physical Therapy

## 2014-05-07 DIAGNOSIS — R269 Unspecified abnormalities of gait and mobility: Secondary | ICD-10-CM | POA: Diagnosis present

## 2014-05-07 DIAGNOSIS — R29898 Other symptoms and signs involving the musculoskeletal system: Secondary | ICD-10-CM | POA: Diagnosis not present

## 2014-05-07 NOTE — Therapy (Signed)
[facility] [address] [place], [place], [address] Phone: [phone]   Fax:  [phone]  Physical Therapy Treatment  Patient Details  Name: Sara Aguilar MRN: [id number] Date of Birth: [birth year] Referring Provider:  Ala Bent, MD  Encounter Date: 05/07/2014      PT End of Session - 05/07/14 2100    Visit Number 6   Number of Visits 17   Date for PT Re-Evaluation 06/15/14   Authorization Type G-code every 10th visit.   PT Start Time 1106   PT Stop Time 1147   PT Time Calculation (min) 41 min   Activity Tolerance Patient tolerated treatment well   Behavior During Therapy WFL for tasks assessed/performed      Past Medical History  Diagnosis Date  . Anemia   . Osteoporosis   . Multiple sclerosis     No past surgical history on file.  There were no vitals filed for this visit.  Visit Diagnosis:  Abnormality of gait  Weakness of right lower extremity      Subjective Assessment - 05/07/14 1109    Subjective "Those balance exercises never helped me when I came to therapy before.  They actually make my balance worse."  Pt denies falls or changes since last visit.  Was sore yesterday after swimming but better today.   Pertinent History MS, meningoma resection 2015   Patient Stated Goals Walk without fear of falling, go up steps or curb without handrail   Currently in Pain? Yes   Pain Score 7    Pain Location Back   Pain Orientation Right   Pain Descriptors / Indicators Sharp   Pain Type Chronic pain   Pain Onset More than a month ago   Pain Frequency Intermittent   Multiple Pain Sites No                       [facility] Adult PT Treatment/Exercise - 05/07/14 2054    Lumbar Exercises: Supine   Other Supine Lumbar Exercises heel slides with bil heels on red therapy ball x 15   Other Supine Lumbar Exercises bridging x 15 with LE's on red therapy ball, posterior pelvic tilt x 15 supine   Knee/Hip Exercises: Standing   Other Standing Knee Exercises standing on black platform of BOSU in parallel bars with intermittent support.  Squats x 10 on bosu with UE support.   Other Standing Knee Exercises bil hip extension, abduction x 10    Knee/Hip Exercises: Supine   Bridges Both;10 reps   Other Supine Knee Exercises hooklying hip abduction with red theraband x 10   Knee/Hip Exercises: Sidelying   Hip ABduction Right;10 reps             Balance Exercises - 05/07/14 2051    Balance Exercises: Standing   SLS Eyes open;Solid surface;Upper extremity support 2;3 reps;10 secs   Balance Beam foam balance beam inside parallel bars with UE support for static, head turns and head nods.  Pt reports dizziness/balance worsens after activities.  Also c/o fatigue.           PT Education - 05/07/14 2059    Education provided Yes   Education Details Energy conservation with HEP and exercises/swimming at the [employer]) Educated Patient   Methods Explanation   Comprehension Verbalized understanding          PT Short Term Goals - 05/01/14 1408    PT SHORT TERM GOAL #  1   Title Pt will be independent in HEP to improve functional mobility. Target date: 05/14/14.   Status On-going   PT SHORT TERM GOAL #2   Title Pt will improve BERG score to >/=35/56 to decrease falls risk. Target date 05/14/14.   Status On-going   PT SHORT TERM GOAL #3   Title Pt will ambulate 300' over even/uneven terrain with LRAD and supervision to improve functional mobility. Target date: 05/14/14.   Status On-going   PT SHORT TERM GOAL #4   Title Pt will traverse 12 steps without handrail with min guard to improve functional mobility. Target date: 05/14/14.   Status On-going   PT SHORT TERM GOAL #5   Title Pt will perform TUG with LRAD in </=13.5 seconds to decrease falls risk. Target date: 05/14/14.   Status On-going           PT Long Term Goals - 05/01/14 1408    PT LONG TERM GOAL #1   Title Pt will  verbalize understanding of falls prevention handout to decrease falls risk. Target date: 06/11/14.   Status On-going   PT LONG TERM GOAL #2   Title Pt will improve BERG score to >/=39/56 to decrease falls risk. Target date: 06/11/14.   Status On-going   PT LONG TERM GOAL #3   Title Pt will ambulate 600' over even/uneven terrain (including sand) with LRAD at MOD I level to improve functional mobility. Target date: 06/11/14.   Status On-going   PT LONG TERM GOAL #4   Title Pt will traverse 12 steps and curb without handrail, independently, in order to improve functional mobility and to traverse stairs at home. Target date: 06/11/14.   Status On-going   PT LONG TERM GOAL #5   Title Pt will improve NeuroQOL-LE to >/=43 to improve quality of life. Target date: 06/11/14.   Status On-going   PT LONG TERM GOAL #6   Title Pt will ambulate 200' while receiving external perturbations without LOB, with supervision, in order to walk her dog without falling. Target date: 06/11/14.   Status On-going               Plan - 05/07/14 2100    Clinical Impression Statement Pt continues with decreased balance and gait deviations but R hip abductor strength improving with less visible trendelenburg gait pattern.  Continue PT per POC.   Pt will benefit from skilled therapeutic intervention in order to improve on the following deficits Abnormal gait;Decreased endurance;Decreased knowledge of use of DME;Decreased balance;Decreased strength;Decreased mobility   Rehab Potential Good   PT Frequency 2x / week   PT Duration 8 weeks   PT Treatment/Interventions ADLs/Self Care Home Management;Gait training;Neuromuscular re-education;Stair training;Biofeedback;Functional mobility training;Patient/family education;Cryotherapy;Therapeutic activities;Electrical Stimulation;Therapeutic exercise;Manual techniques;DME Instruction;Balance training   PT Next Visit Plan Continue RLE strengthening and core strengthening, dynamic  gait training.   Consulted and Agree with Plan of Care Patient        Problem List There are no active problems to display for this patient.   Narda Bonds 05/07/2014, 9:03 PM  [facility] [address] [place], [place], [address] Phone: [phone]   Fax:  [place], [place] 05/07/2014 9:03 PM Phone: [phone] Fax: [phone]

## 2014-05-09 ENCOUNTER — Ambulatory Visit: Payer: Medicare Other | Admitting: Physical Therapy

## 2014-05-09 ENCOUNTER — Encounter: Payer: Self-pay | Admitting: Physical Therapy

## 2014-05-09 DIAGNOSIS — R269 Unspecified abnormalities of gait and mobility: Secondary | ICD-10-CM

## 2014-05-09 NOTE — Therapy (Signed)
[facility] [address] [place], [place], [address] Phone: [phone]   Fax:  [phone]  Physical Therapy Treatment  Patient Details  Name: Sara Aguilar MRN: [id number] Date of Birth: [birth year] Referring Provider:  Ala Bent, MD  Encounter Date: 05/09/2014      PT End of Session - 05/09/14 1730    Visit Number 7   Number of Visits 17   Date for PT Re-Evaluation 06/15/14   Authorization Type G-code every 10th visit.   PT Start Time 1408   PT Stop Time 1449   PT Time Calculation (min) 41 min   Activity Tolerance Patient tolerated treatment well   Behavior During Therapy WFL for tasks assessed/performed      Past Medical History  Diagnosis Date  . Anemia   . Osteoporosis   . Multiple sclerosis     History reviewed. No pertinent past surgical history.  There were no vitals filed for this visit.  Visit Diagnosis:  Abnormality of gait      Subjective Assessment - 05/09/14 1726    Subjective Denies falls.  Doesnt want to use Rollator because she feels like she will become dependent on it.   Pertinent History MS, meningoma resection 2015   Patient Stated Goals Walk without fear of falling, go up steps or curb without handrail   Currently in Pain? No/denies                       [facility] Adult PT Treatment/Exercise - 05/09/14 1416    Transfers   Transfers Sit to Stand;Stand to Sit   Sit to Stand 6: Modified independent (Device/Increase time);With upper extremity assist;From chair/3-in-1   Stand to Sit 6: Modified independent (Device/Increase time);With upper extremity assist;To chair/3-in-1   Ambulation/Gait   Ambulation/Gait Yes   Ambulation/Gait Assistance 4: Min guard;5: Supervision   Ambulation/Gait Assistance Details gait steadiness and fluidity improved with Rollator   Ambulation Distance (Feet) 220 Feet  three times   Assistive device Rollator;None   Gait Pattern Step-to  pattern;Step-through pattern;Decreased stride length;Right genu recurvatum;Decreased trunk rotation;Decreased dorsiflexion - right;Wide base of support   Ambulation Surface Level;Indoor   Berg Balance Test   Sit to Stand Able to stand without using hands and stabilize independently   Standing Unsupported Able to stand 30 seconds unsupported   Sitting with Back Unsupported but Feet Supported on Floor or Stool Able to sit safely and securely 2 minutes   Stand to Sit Sits safely with minimal use of hands   Transfers Able to transfer safely, minor use of hands   Standing Unsupported with Eyes Closed Needs help to keep from falling   Standing Ubsupported with Feet Together Needs help to attain position but able to stand for 30 seconds with feet together   From Standing, Reach Forward with Outstretched Arm Can reach forward >12 cm safely (5")   From Standing Position, Pick up Object from Floor Able to pick up shoe safely and easily   From Standing Position, Turn to Look Behind Over each Shoulder Turn sideways only but maintains balance   Turn 360 Degrees Needs close supervision or verbal cueing   Standing Unsupported, Alternately Place Feet on Step/Stool Able to complete >2 steps/needs minimal assist   Standing Unsupported, One Foot in Front Able to take small step independently and hold 30 seconds   Standing on One Leg Tries to lift leg/unable to hold 3 seconds but remains standing independently   Total  Score 33                PT Education - 05/09/14 1729    Education provided Yes   Education Details Recommendation to use RW for improved safety, improved gait and improved endurance   Person(s) Educated Patient   Methods Explanation   Comprehension Verbalized understanding          PT Short Term Goals - 05/09/14 1733    PT SHORT TERM GOAL #1   Title Pt will be independent in HEP to improve functional mobility. Target date: 05/14/14.   Status On-going   PT SHORT TERM GOAL #2    Title Pt will improve BERG score to >/=35/56 to decrease falls risk. Target date 05/14/14.   Status Not Met   PT SHORT TERM GOAL #3   Title Pt will ambulate 300' over even/uneven terrain with LRAD and supervision to improve functional mobility. Target date: 05/14/14.   Status Not Met   PT SHORT TERM GOAL #4   Title Pt will traverse 12 steps without handrail with min guard to improve functional mobility. Target date: 05/14/14.   Status Not Met   PT SHORT TERM GOAL #5   Title Pt will perform TUG with LRAD in </=13.5 seconds to decrease falls risk. Target date: 05/14/14.   Status On-going           PT Long Term Goals - 05/01/14 1408    PT LONG TERM GOAL #1   Title Pt will verbalize understanding of falls prevention handout to decrease falls risk. Target date: 06/11/14.   Status On-going   PT LONG TERM GOAL #2   Title Pt will improve BERG score to >/=39/56 to decrease falls risk. Target date: 06/11/14.   Status On-going   PT LONG TERM GOAL #3   Title Pt will ambulate 600' over even/uneven terrain (including sand) with LRAD at MOD I level to improve functional mobility. Target date: 06/11/14.   Status On-going   PT LONG TERM GOAL #4   Title Pt will traverse 12 steps and curb without handrail, independently, in order to improve functional mobility and to traverse stairs at home. Target date: 06/11/14.   Status On-going   PT LONG TERM GOAL #5   Title Pt will improve NeuroQOL-LE to >/=43 to improve quality of life. Target date: 06/11/14.   Status On-going   PT LONG TERM GOAL #6   Title Pt will ambulate 200' while receiving external perturbations without LOB, with supervision, in order to walk her dog without falling. Target date: 06/11/14.   Status On-going               Plan - 05/09/14 1730    Clinical Impression Statement Pt continues with decreased balance and decreased static standing tolerance.  Pt did not meet STG 2,3 or 4.     Pt will benefit from skilled therapeutic intervention  in order to improve on the following deficits Abnormal gait;Decreased endurance;Decreased knowledge of use of DME;Decreased balance;Decreased strength;Decreased mobility   Rehab Potential Good   PT Frequency 2x / week   PT Duration 8 weeks   PT Treatment/Interventions ADLs/Self Care Home Management;Gait training;Neuromuscular re-education;Stair training;Biofeedback;Functional mobility training;Patient/family education;Cryotherapy;Therapeutic activities;Electrical Stimulation;Therapeutic exercise;Manual techniques;DME Instruction;Balance training   PT Next Visit Plan Finish checking goals (1&5).   Consulted and Agree with Plan of Care Patient        Problem List There are no active problems to display for this patient.   Narda Bonds 05/09/2014, 5:35  PM  [facility] [address] [place], [place], [address] Phone: [phone]   Fax:  [place], [place] 05/09/2014 5:35 PM Phone: [phone] Fax: [phone]

## 2014-05-14 ENCOUNTER — Ambulatory Visit: Payer: Medicare Other

## 2014-05-14 DIAGNOSIS — R269 Unspecified abnormalities of gait and mobility: Secondary | ICD-10-CM | POA: Diagnosis not present

## 2014-05-14 DIAGNOSIS — R29898 Other symptoms and signs involving the musculoskeletal system: Secondary | ICD-10-CM

## 2014-05-14 NOTE — Patient Instructions (Signed)
Abduction: Clam (Eccentric) - Side-Lying   Lie on side with knees bent and black band tied above your knees. Lift top knee, keeping feet together. Keep trunk steady, then slowly lower back down. _10__ reps per set, _3__ sets per day, _3__ days per week.    Bridge   Lie back, legs bent. Inhale, pressing hips up. Keeping ribs in, lengthen lower back. Exhale, rolling down along spine from top. Repeat _20___ times. Do _1___ sessions per day.  Copyright  VHI. All rights reserved.   Copyright  VHI. All rights reserved.    Bridge Pose, One Leg   Bring knee to chest or keep left leg straight out (you can rest it on bed/floor if needed). Tuck in stomach and then lift hips up towards ceiling. Focus on engaging posterior hip muscles. Hold for _2___ seconds. Repeat __15__ times on right leg only.  Copyright  VHI. All rights reserved.   Marching in Place: Varied Surfaces   Hold onto counter and March in place, slowly lifting knees toward ceiling. Repeat __20__ times per session. Do __1__ sessions per day.  Copyright  VHI. All rights reserved.   EXTENSION: Standing (Active)   Stand, both feet flat. Draw right leg behind body as far as possible. Repeat with left leg. Complete _3__ sets of _10__ repetitions. Perform __3_ sessions per week.  http://gtsc.exer.us/77   Copyright  VHI. All rights reserved.

## 2014-05-14 NOTE — Therapy (Signed)
[facility] [address] [place], [place], [address] Phone: [phone]   Fax:  [phone]  Physical Therapy Treatment  Patient Details  Name: Sara Aguilar MRN: [id number] Date of Birth: January 01, 1946 Referring Provider:  Ala Bent, MD  Encounter Date: 05/14/2014      PT End of Session - 05/14/14 1449    Visit Number 8   Number of Visits 17   Date for PT Re-Evaluation 06/15/14   Authorization Type G-code every 10th visit.   PT Start Time 1402   PT Stop Time 1446   PT Time Calculation (min) 44 min   Activity Tolerance Patient tolerated treatment well   Behavior During Therapy WFL for tasks assessed/performed      Past Medical History  Diagnosis Date  . Anemia   . Osteoporosis   . Multiple sclerosis     History reviewed. No pertinent past surgical history.  There were no vitals filed for this visit.  Visit Diagnosis:  Abnormality of gait  Weakness of right lower extremity      Subjective Assessment - 05/14/14 1404    Subjective Pt denied falls or changes since last visit. Pt felt great after swimming yesterday, but felt worse after sitting for a long time today.   Pertinent History MS, meningoma resection 2015   Patient Stated Goals Walk without fear of falling, go up steps or curb without handrail   Currently in Pain? Yes   Pain Score 6    Pain Location Back   Pain Orientation Lower   Pain Descriptors / Indicators Aching   Pain Type Chronic pain   Pain Onset More than a month ago   Pain Frequency Constant   Aggravating Factors  walking   Pain Relieving Factors rest/swimming at [facility]       Therex: Constance Haw with TrA activation with BLE and with R LE only x20/activity.  -Bridges with B hip marches x15. Pt noted to experience poor eccentric control. -B clamshells 3x10 with black theraband. VC's to keep hips forward. -Hooklying B hip marches with black theraband 3x10.  -Standing with 1-2 UE  support at counter: B hip marches x10 and hip ext 3x10/LE. VC's for technique.                [facility] Adult PT Treatment/Exercise - 05/14/14 1412    Standardized Balance Assessment   Standardized Balance Assessment Timed Up and Go Test   Timed Up and Go Test   TUG Normal TUG   Normal TUG (seconds) 17.2  without AD                PT Education - 05/14/14 1448    Education provided Yes   Education Details Reviewed strengthening HEP and progressed as tolerated.   Person(s) Educated Patient   Methods Explanation;Demonstration;Verbal cues;Handout;Tactile cues   Comprehension Verbalized understanding;Returned demonstration          PT Short Term Goals - 05/14/14 1451    PT SHORT TERM GOAL #1   Title Pt will be independent in HEP to improve functional mobility. Target date: 05/14/14.   Status On-going   PT SHORT TERM GOAL #2   Title Pt will improve BERG score to >/=35/56 to decrease falls risk. Target date 05/14/14.   Status Not Met   PT SHORT TERM GOAL #3   Title Pt will ambulate 300' over even/uneven terrain with LRAD and supervision to improve functional mobility. Target date: 05/14/14.   Status Not Met  PT SHORT TERM GOAL #4   Title Pt will traverse 12 steps without handrail with min guard to improve functional mobility. Target date: 05/14/14.   Status Not Met   PT SHORT TERM GOAL #5   Title Pt will perform TUG with LRAD in </=13.5 seconds to decrease falls risk. Target date: 05/14/14.   Status Partially Met           PT Long Term Goals - 05/01/14 1408    PT LONG TERM GOAL #1   Title Pt will verbalize understanding of falls prevention handout to decrease falls risk. Target date: 06/11/14.   Status On-going   PT LONG TERM GOAL #2   Title Pt will improve BERG score to >/=39/56 to decrease falls risk. Target date: 06/11/14.   Status On-going   PT LONG TERM GOAL #3   Title Pt will ambulate 600' over even/uneven terrain (including sand) with LRAD at MOD I level  to improve functional mobility. Target date: 06/11/14.   Status On-going   PT LONG TERM GOAL #4   Title Pt will traverse 12 steps and curb without handrail, independently, in order to improve functional mobility and to traverse stairs at home. Target date: 06/11/14.   Status On-going   PT LONG TERM GOAL #5   Title Pt will improve NeuroQOL-LE to >/=43 to improve quality of life. Target date: 06/11/14.   Status On-going   PT LONG TERM GOAL #6   Title Pt will ambulate 200' while receiving external perturbations without LOB, with supervision, in order to walk her dog without falling. Target date: 06/11/14.   Status On-going               Plan - 05/14/14 1449    Clinical Impression Statement Pt partially met STG 5. PT will finish assessing HEP next session. Pt would continue to benefit from skliled PT to improve safety during functional mobility, as pt's TUG time and BERG score indicated pt is still at risk for falls.    Pt will benefit from skilled therapeutic intervention in order to improve on the following deficits Abnormal gait;Decreased endurance;Decreased knowledge of use of DME;Decreased balance;Decreased strength;Decreased mobility   Rehab Potential Good   PT Frequency 2x / week   PT Duration 8 weeks   PT Treatment/Interventions ADLs/Self Care Home Management;Gait training;Neuromuscular re-education;Stair training;Biofeedback;Functional mobility training;Patient/family education;Cryotherapy;Therapeutic activities;Electrical Stimulation;Therapeutic exercise;Manual techniques;DME Instruction;Balance training   PT Next Visit Plan Finish checking HEP goal.   Consulted and Agree with Plan of Care Patient        Problem List There are no active problems to display for this patient.   Miller,Jennifer L 05/14/2014, 2:52 PM  [facility] [address] [place], [place], [address] Phone: [phone]   Fax:   [phone]     Geoffry Paradise, PT,DPT 05/14/2014 2:52 PM Phone: [phone] Fax: [phone]

## 2014-05-15 ENCOUNTER — Ambulatory Visit: Payer: Medicare Other

## 2014-05-21 ENCOUNTER — Ambulatory Visit: Payer: Medicare Other

## 2014-05-24 ENCOUNTER — Ambulatory Visit: Payer: Medicare Other

## 2014-05-24 NOTE — Therapy (Signed)
[facility] [address] [place], [place], [address] Phone: [phone]   Fax:  [phone]  Patient Details  Name: Sara Aguilar MRN: [id number] Date of Birth: Aug 14, 1945 Referring Provider:  No ref. provider found  Encounter Date: 05/24/2014   PHYSICAL THERAPY DISCHARGE SUMMARY  Visits from Start of Care: 8  Current functional level related to goals / functional outcomes:     PT Short Term Goals - 05/14/14 1451    PT SHORT TERM GOAL #1   Title Pt will be independent in HEP to improve functional mobility. Target date: 05/14/14.   Status On-going   PT SHORT TERM GOAL #2   Title Pt will improve BERG score to >/=35/56 to decrease falls risk. Target date 05/14/14.   Status Not Met   PT SHORT TERM GOAL #3   Title Pt will ambulate 300' over even/uneven terrain with LRAD and supervision to improve functional mobility. Target date: 05/14/14.   Status Not Met   PT SHORT TERM GOAL #4   Title Pt will traverse 12 steps without handrail with min guard to improve functional mobility. Target date: 05/14/14.   Status Not Met   PT SHORT TERM GOAL #5   Title Pt will perform TUG with LRAD in </=13.5 seconds to decrease falls risk. Target date: 05/14/14.   Status Partially Met         PT Long Term Goals - 05/01/14 1408    PT LONG TERM GOAL #1   Title Pt will verbalize understanding of falls prevention handout to decrease falls risk. Target date: 06/11/14.   Status On-going   PT LONG TERM GOAL #2   Title Pt will improve BERG score to >/=39/56 to decrease falls risk. Target date: 06/11/14.   Status On-going   PT LONG TERM GOAL #3   Title Pt will ambulate 600' over even/uneven terrain (including sand) with LRAD at MOD I level to improve functional mobility. Target date: 06/11/14.   Status On-going   PT LONG TERM GOAL #4   Title Pt will traverse 12 steps and curb without handrail, independently, in order to improve functional mobility  and to traverse stairs at home. Target date: 06/11/14.   Status On-going   PT LONG TERM GOAL #5   Title Pt will improve NeuroQOL-LE to >/=43 to improve quality of life. Target date: 06/11/14.   Status On-going   PT LONG TERM GOAL #6   Title Pt will ambulate 200' while receiving external perturbations without LOB, with supervision, in order to walk her dog without falling. Target date: 06/11/14.   Status On-going        Remaining deficits: Impaired balance, weakness, and decreased endurance (per pt's last visit). Pt called and cancelled remaining PT appointments, as she felt it was not helping. PT and PTA discussed performing HEP with pt and she reported she did not feel that balance exercises were helpful.   Education / Equipment: HEP  Plan: Patient agrees to discharge.  Patient goals were not met. Patient is being discharged due to not returning since the last visit.  ?????       Alaylah Heatherington L 05/24/2014, 12:41 PM  [facility] [address] [place], [place], [address] Phone: [phone]   Fax:  [phone]   Geoffry Paradise, PT,DPT 05/24/2014 12:42 PM Phone: [phone] Fax: [phone]

## 2014-05-28 ENCOUNTER — Ambulatory Visit: Payer: Medicare Other

## 2014-05-30 ENCOUNTER — Ambulatory Visit: Payer: Medicare Other | Admitting: Physical Therapy

## 2014-06-04 ENCOUNTER — Ambulatory Visit: Payer: Medicare Other

## 2014-06-06 ENCOUNTER — Ambulatory Visit: Payer: Medicare Other | Admitting: Physical Therapy

## 2015-10-15 ENCOUNTER — Other Ambulatory Visit: Payer: Self-pay

## 2016-03-23 ENCOUNTER — Emergency Department (HOSPITAL_COMMUNITY)
Admission: EM | Admit: 2016-03-23 | Discharge: 2016-03-23 | Disposition: A | Discharge status: Home / Self Care | Payer: Medicare Other | Attending: Emergency Medicine | Admitting: Emergency Medicine

## 2016-03-23 ENCOUNTER — Encounter (HOSPITAL_COMMUNITY): Payer: Self-pay | Admitting: Emergency Medicine

## 2016-03-23 DIAGNOSIS — M62831 Muscle spasm of calf: Secondary | ICD-10-CM | POA: Diagnosis not present

## 2016-03-23 DIAGNOSIS — M79605 Pain in left leg: Secondary | ICD-10-CM | POA: Diagnosis present

## 2016-03-23 DIAGNOSIS — M62838 Other muscle spasm: Secondary | ICD-10-CM

## 2016-03-23 LAB — CBC WITH DIFFERENTIAL/PLATELET
BASOS PCT: 2 %
Basophils Absolute: 0.2 10*3/uL — ABNORMAL HIGH (ref 0.0–0.1)
EOS ABS: 0 10*3/uL (ref 0.0–0.7)
EOS PCT: 0 %
HCT: 35.3 % — ABNORMAL LOW (ref 36.0–46.0)
Hemoglobin: 11.6 g/dL — ABNORMAL LOW (ref 12.0–15.0)
Lymphocytes Relative: 18 %
Lymphs Abs: 1.6 10*3/uL (ref 0.7–4.0)
MCH: 31.8 pg (ref 26.0–34.0)
MCHC: 32.9 g/dL (ref 30.0–36.0)
MCV: 96.7 fL (ref 78.0–100.0)
MONO ABS: 0.6 10*3/uL (ref 0.1–1.0)
MONOS PCT: 7 %
Neutro Abs: 6.4 10*3/uL (ref 1.7–7.7)
Neutrophils Relative %: 73 %
PLATELETS: 195 10*3/uL (ref 150–400)
RBC: 3.65 MIL/uL — ABNORMAL LOW (ref 3.87–5.11)
RDW: 12.7 % (ref 11.5–15.5)
WBC: 8.8 10*3/uL (ref 4.0–10.5)

## 2016-03-23 LAB — URINALYSIS, ROUTINE W REFLEX MICROSCOPIC
Bilirubin Urine: NEGATIVE
GLUCOSE, UA: NEGATIVE mg/dL
Hgb urine dipstick: NEGATIVE
Ketones, ur: 5 mg/dL — AB
Leukocytes, UA: NEGATIVE
NITRITE: NEGATIVE
PROTEIN: 30 mg/dL — AB
RBC / HPF: NONE SEEN RBC/hpf (ref 0–5)
SPECIFIC GRAVITY, URINE: 1.017 (ref 1.005–1.030)
Squamous Epithelial / LPF: NONE SEEN
WBC, UA: NONE SEEN WBC/hpf (ref 0–5)
pH: 8 (ref 5.0–8.0)

## 2016-03-23 LAB — BASIC METABOLIC PANEL
Anion gap: 12 (ref 5–15)
BUN: 21 mg/dL — ABNORMAL HIGH (ref 6–20)
CALCIUM: 9.6 mg/dL (ref 8.9–10.3)
CO2: 25 mmol/L (ref 22–32)
CREATININE: 0.64 mg/dL (ref 0.44–1.00)
Chloride: 103 mmol/L (ref 101–111)
GFR calc Af Amer: >60 mL/min (ref >60)
GLUCOSE: 118 mg/dL — AB (ref 65–99)
Potassium: 3.6 mmol/L (ref 3.5–5.1)
Sodium: 140 mmol/L (ref 135–145)

## 2016-03-23 NOTE — ED Notes (Signed)
No complaints of calf pain.  Leg looks relaxed.  EMS stated at first, left ankle looked like it was having spasms and that it was contracting.  At this time, no spasms noted.

## 2016-03-23 NOTE — ED Provider Notes (Signed)
[facility] DEPT Provider Note   CSN: [id number] Arrival date & time: 03/23/16  1537     History   Chief Complaint Chief Complaint  Patient presents with  . Leg Pain    HPI Sara Aguilar is a 71 y.o. female.  [facility] [facility] [place] [facility] [place]. States she is feeling a little dizzy. States all she was working out she began to have left leg pain. Cramped up and involved the lower leg and then worked its way up to the upper leg. And she states there is some involvement of the right leg. During this tingling of her left face. No arm numbness or weakness. Does have some chronic mild weakness on the right side due to her MS. No headache. No confusion. States she did have an episode of dizziness which was the room spinning when this all was going on also. States she feels better now. Has had no fevers. No real cough. EMS note said that she had become anxious and left ankle began to have spasms and contract. Had urinary incontinence. Feeling somewhat better now.   Past Medical History:  Diagnosis Date  . Anemia   . Multiple sclerosis ([place])   . Osteoporosis     There are no active problems to display for this patient.   History reviewed. No pertinent surgical history.  OB History    No data available       Home Medications    Prior to Admission medications   Medication Sig Start Date End Date Taking? Authorizing Provider  cholecalciferol (VITAMIN D) 1000 UNITS tablet Take 1,000 Units by mouth daily. Pt did not include dose or instructions.    Historical Provider, MD  gabapentin (NEURONTIN) 300 MG capsule Take 300 mg by mouth. Pt did not include instructions    Historical Provider, MD  raloxifene (EVISTA) 60 MG tablet Take 60 mg by mouth daily. Pt did not include instructions    Historical Provider, MD  temazepam (RESTORIL) 7.5 MG capsule Take 7.5 mg by mouth at bedtime as needed for sleep. Pt did not include instructions.    Historical Provider, MD    Family  History History reviewed. No pertinent family history.  Social History Social History  Substance Use Topics  . Smoking status: Never Smoker  . Smokeless tobacco: Never Used  . Alcohol use No     Allergies   Patient has no known allergies.   Review of Systems Review of Systems  Constitutional: Positive for fatigue. Negative for appetite change.  HENT: Negative for congestion.   Respiratory: Negative for shortness of breath.   Cardiovascular: Negative for chest pain.  Gastrointestinal: Negative for abdominal pain.  Genitourinary: Negative for dysuria.  Musculoskeletal: Negative for back pain.  Skin: Negative for rash.  Neurological: Positive for dizziness, weakness and light-headedness.  Psychiatric/Behavioral: Negative for confusion.     Physical Exam Updated Vital Signs BP 129/59 (BP Location: Right Arm)   Pulse 67   Temp 98.1 F (36.7 C) (Oral)   Resp 13   Ht 5' 5.5" (1.664 m)   Wt 120 lb (54.4 kg)   SpO2 100%   BMI 19.67 kg/m   Physical Exam  Constitutional: She appears well-developed.  HENT:  Head: Atraumatic.  Eyes: EOM are normal.  Cardiovascular: Normal rate.   Pulmonary/Chest: Effort normal.  Abdominal: Soft. There is no tenderness.  Musculoskeletal: She exhibits no edema.  Neurological: She is alert.  Face symmetric. Sensation appears symmetric on the face also. Good dressing bilaterally.  Good flexion extension at the elbow good flexion extension at the ankles. Good straight leg raise bilaterally.  Skin: Skin is warm. Capillary refill takes less than 2 seconds.  Psychiatric: She has a normal mood and affect.     ED Treatments / Results  Labs (all labs ordered are listed, but only abnormal results are displayed) Labs Reviewed  BASIC METABOLIC PANEL - Abnormal; Notable for the following:       Result Value   Glucose, Bld 118 (*)    BUN 21 (*)    All other components within normal limits  CBC WITH DIFFERENTIAL/PLATELET - Abnormal; Notable for  the following:    RBC 3.65 (*)    Hemoglobin 11.6 (*)    HCT 35.3 (*)    Basophils Absolute 0.2 (*)    All other components within normal limits  URINALYSIS, ROUTINE W REFLEX MICROSCOPIC - Abnormal; Notable for the following:    APPearance TURBID (*)    Ketones, ur 5 (*)    Protein, ur 30 (*)    Bacteria, UA RARE (*)    All other components within normal limits    EKG  EKG Interpretation None       Radiology No results found.  Procedures Procedures (including critical care time)  Medications Ordered in ED Medications - No data to display   Initial Impression / Assessment and Plan / ED Course  I have reviewed the triage vital signs and the nursing notes.  Pertinent labs & imaging results that were available during my care of the patient were reviewed by me and considered in my medical decision making (see chart for details).     Patient had what sounds like muscle cramps in her legs. Poorly had a spasm in the left leg that also involve the right somewhat. Improved now. Does have history of MS and if this were a flare I would expect it to have been more constant. Labs overall reassuring. had some mild dehydration on the labs however. Patient is tolerating orals and will increase that at home. Feels better now. Will discharge home.  Final Clinical Impressions(s) / ED Diagnoses   Final diagnoses:  Muscle spasm    New Prescriptions New Prescriptions   No medications on file     Davonna Belling, MD 03/23/16 [phone]

## 2016-03-23 NOTE — ED Triage Notes (Signed)
Per EMS, patient was at [facility] and began having left leg pain.  Patient does have history of MS.  Patient had to sit down because pain became too intense and patient was unable to ambulate.  Patient became super anxious and left ankle began to have spasms and contract.  2.5 versed given en route.  Became incontinent on stretcher.  A/O x 4.  Left ankle now looks relaxed.  MS doctor in [place].  No neuro deficits noted.  150/88, NSR, 98% RA. 20G Left hand.

## 2018-02-14 ENCOUNTER — Other Ambulatory Visit: Payer: Self-pay | Admitting: Internal Medicine

## 2018-02-14 DIAGNOSIS — R634 Abnormal weight loss: Secondary | ICD-10-CM

## 2018-02-14 DIAGNOSIS — K219 Gastro-esophageal reflux disease without esophagitis: Secondary | ICD-10-CM

## 2018-02-14 DIAGNOSIS — R1013 Epigastric pain: Secondary | ICD-10-CM

## 2018-02-21 ENCOUNTER — Ambulatory Visit
Admission: RE | Admit: 2018-02-21 | Discharge: 2018-02-21 | Disposition: A | Discharge status: Home / Self Care | Payer: Medicare Other | Source: Ambulatory Visit | Attending: Internal Medicine | Admitting: Internal Medicine

## 2018-02-21 DIAGNOSIS — R634 Abnormal weight loss: Secondary | ICD-10-CM

## 2018-02-21 DIAGNOSIS — K219 Gastro-esophageal reflux disease without esophagitis: Secondary | ICD-10-CM

## 2018-02-21 DIAGNOSIS — R1013 Epigastric pain: Secondary | ICD-10-CM

## 2019-02-22 ENCOUNTER — Ambulatory Visit: Payer: Medicare PPO | Attending: Internal Medicine

## 2019-02-22 DIAGNOSIS — Z23 Encounter for immunization: Secondary | ICD-10-CM | POA: Insufficient documentation

## 2019-02-22 NOTE — Progress Notes (Signed)
   Covid-19 Vaccination Clinic  Name:  Sara Aguilar    MRN: [id number] DOB: 29-Dec-1945  02/22/2019  [name] was observed post Covid-19 immunization for 15 minutes without incidence. She was provided with Vaccine Information Sheet and instruction to access the V-Safe system.   [name] was instructed to call 911 with any severe reactions post vaccine: Marland Kitchen Difficulty breathing  . Swelling of your face and throat  . A fast heartbeat  . A bad rash all over your body  . Dizziness and weakness    Immunizations Administered    Name Date Dose VIS Date Route   Pfizer COVID-19 Vaccine 02/22/2019  3:35 PM 0.3 mL 01/12/2019 Intramuscular   Manufacturer: [place]   Lot: [id number]   [place]: [id number]

## 2019-03-15 ENCOUNTER — Ambulatory Visit: Payer: Medicare PPO | Attending: Internal Medicine

## 2019-03-15 DIAGNOSIS — Z23 Encounter for immunization: Secondary | ICD-10-CM | POA: Insufficient documentation

## 2019-03-15 NOTE — Progress Notes (Signed)
   Covid-19 Vaccination Clinic  Name:  Sara Aguilar    MRN: [id number] DOB: Jun 29, 1945  03/15/2019  [name] was observed post Covid-19 immunization for 15 minutes without incidence. She was provided with Vaccine Information Sheet and instruction to access the V-Safe system.   [name] was instructed to call 911 with any severe reactions post vaccine: Marland Kitchen Difficulty breathing  . Swelling of your face and throat  . A fast heartbeat  . A bad rash all over your body  . Dizziness and weakness    Immunizations Administered    Name Date Dose VIS Date Route   Pfizer COVID-19 Vaccine 03/15/2019  4:56 PM 0.3 mL 01/12/2019 Intramuscular   Manufacturer: [place]   Lot: [id number]   [place]: [id number]

## 2019-07-26 DIAGNOSIS — H2512 Age-related nuclear cataract, left eye: Secondary | ICD-10-CM | POA: Diagnosis not present

## 2019-07-26 DIAGNOSIS — H524 Presbyopia: Secondary | ICD-10-CM | POA: Diagnosis not present

## 2019-07-26 DIAGNOSIS — H25812 Combined forms of age-related cataract, left eye: Secondary | ICD-10-CM | POA: Diagnosis not present

## 2019-07-26 DIAGNOSIS — H5203 Hypermetropia, bilateral: Secondary | ICD-10-CM | POA: Diagnosis not present

## 2019-07-26 DIAGNOSIS — H52223 Regular astigmatism, bilateral: Secondary | ICD-10-CM | POA: Diagnosis not present

## 2019-08-24 DIAGNOSIS — R269 Unspecified abnormalities of gait and mobility: Secondary | ICD-10-CM | POA: Diagnosis not present

## 2019-08-24 DIAGNOSIS — D72819 Decreased white blood cell count, unspecified: Secondary | ICD-10-CM | POA: Diagnosis not present

## 2019-08-24 DIAGNOSIS — Z0000 Encounter for general adult medical examination without abnormal findings: Secondary | ICD-10-CM | POA: Diagnosis not present

## 2019-08-24 DIAGNOSIS — G4700 Insomnia, unspecified: Secondary | ICD-10-CM | POA: Diagnosis not present

## 2019-08-24 DIAGNOSIS — D649 Anemia, unspecified: Secondary | ICD-10-CM | POA: Diagnosis not present

## 2019-08-24 DIAGNOSIS — R82998 Other abnormal findings in urine: Secondary | ICD-10-CM | POA: Diagnosis not present

## 2019-08-24 DIAGNOSIS — Z79899 Other long term (current) drug therapy: Secondary | ICD-10-CM | POA: Diagnosis not present

## 2019-08-24 DIAGNOSIS — F325 Major depressive disorder, single episode, in full remission: Secondary | ICD-10-CM | POA: Diagnosis not present

## 2019-08-24 DIAGNOSIS — R634 Abnormal weight loss: Secondary | ICD-10-CM | POA: Diagnosis not present

## 2019-08-24 DIAGNOSIS — M810 Age-related osteoporosis without current pathological fracture: Secondary | ICD-10-CM | POA: Diagnosis not present

## 2019-08-24 DIAGNOSIS — H269 Unspecified cataract: Secondary | ICD-10-CM | POA: Diagnosis not present

## 2019-08-24 DIAGNOSIS — G35 Multiple sclerosis: Secondary | ICD-10-CM | POA: Diagnosis not present

## 2019-08-24 DIAGNOSIS — Z1331 Encounter for screening for depression: Secondary | ICD-10-CM | POA: Diagnosis not present

## 2019-08-24 DIAGNOSIS — K5900 Constipation, unspecified: Secondary | ICD-10-CM | POA: Diagnosis not present

## 2019-08-24 DIAGNOSIS — M6281 Muscle weakness (generalized): Secondary | ICD-10-CM | POA: Diagnosis not present

## 2019-09-24 DIAGNOSIS — H25813 Combined forms of age-related cataract, bilateral: Secondary | ICD-10-CM | POA: Diagnosis not present

## 2019-11-14 DIAGNOSIS — H52222 Regular astigmatism, left eye: Secondary | ICD-10-CM | POA: Diagnosis not present

## 2019-11-14 DIAGNOSIS — H2512 Age-related nuclear cataract, left eye: Secondary | ICD-10-CM | POA: Diagnosis not present

## 2019-11-14 DIAGNOSIS — H25812 Combined forms of age-related cataract, left eye: Secondary | ICD-10-CM | POA: Diagnosis not present

## 2019-11-28 DIAGNOSIS — H52221 Regular astigmatism, right eye: Secondary | ICD-10-CM | POA: Diagnosis not present

## 2019-11-28 DIAGNOSIS — H25811 Combined forms of age-related cataract, right eye: Secondary | ICD-10-CM | POA: Diagnosis not present

## 2019-11-28 DIAGNOSIS — H2511 Age-related nuclear cataract, right eye: Secondary | ICD-10-CM | POA: Diagnosis not present

## 2019-12-06 DIAGNOSIS — D320 Benign neoplasm of cerebral meninges: Secondary | ICD-10-CM | POA: Diagnosis not present

## 2019-12-06 DIAGNOSIS — R9082 White matter disease, unspecified: Secondary | ICD-10-CM | POA: Diagnosis not present

## 2019-12-06 DIAGNOSIS — D329 Benign neoplasm of meninges, unspecified: Secondary | ICD-10-CM | POA: Diagnosis not present

## 2019-12-11 DIAGNOSIS — D329 Benign neoplasm of meninges, unspecified: Secondary | ICD-10-CM | POA: Diagnosis not present

## 2019-12-11 DIAGNOSIS — Z23 Encounter for immunization: Secondary | ICD-10-CM | POA: Diagnosis not present

## 2020-01-07 DIAGNOSIS — G35 Multiple sclerosis: Secondary | ICD-10-CM | POA: Diagnosis not present

## 2020-01-07 DIAGNOSIS — D320 Benign neoplasm of cerebral meninges: Secondary | ICD-10-CM | POA: Diagnosis not present

## 2020-02-12 DIAGNOSIS — J029 Acute pharyngitis, unspecified: Secondary | ICD-10-CM | POA: Diagnosis not present

## 2020-02-12 DIAGNOSIS — Z20822 Contact with and (suspected) exposure to covid-19: Secondary | ICD-10-CM | POA: Diagnosis not present

## 2020-02-20 DIAGNOSIS — Z20822 Contact with and (suspected) exposure to covid-19: Secondary | ICD-10-CM | POA: Diagnosis not present

## 2020-03-27 DIAGNOSIS — H04123 Dry eye syndrome of bilateral lacrimal glands: Secondary | ICD-10-CM | POA: Diagnosis not present

## 2020-03-27 DIAGNOSIS — I7300 Raynaud's syndrome without gangrene: Secondary | ICD-10-CM | POA: Diagnosis not present

## 2020-03-27 DIAGNOSIS — M1711 Unilateral primary osteoarthritis, right knee: Secondary | ICD-10-CM | POA: Diagnosis not present

## 2020-03-27 DIAGNOSIS — M810 Age-related osteoporosis without current pathological fracture: Secondary | ICD-10-CM | POA: Diagnosis not present

## 2020-03-27 DIAGNOSIS — R231 Pallor: Secondary | ICD-10-CM | POA: Diagnosis not present

## 2020-03-27 DIAGNOSIS — M25561 Pain in right knee: Secondary | ICD-10-CM | POA: Diagnosis not present

## 2020-04-07 DIAGNOSIS — R399 Unspecified symptoms and signs involving the genitourinary system: Secondary | ICD-10-CM | POA: Diagnosis not present

## 2020-04-07 DIAGNOSIS — B373 Candidiasis of vulva and vagina: Secondary | ICD-10-CM | POA: Diagnosis not present

## 2020-04-11 DIAGNOSIS — M810 Age-related osteoporosis without current pathological fracture: Secondary | ICD-10-CM | POA: Diagnosis not present

## 2020-06-29 IMAGING — US US ABDOMEN COMPLETE
1 series · 14 of 25 positions shown · non-contrast
Comparison: August 10, 2010, CT abdomen pelvis August 09, 2010

CLINICAL DATA: Epigastric pain for 6 months.

EXAM:
ABDOMEN ULTRASOUND COMPLETE

[Series 1: us abdomen complete · 0.17mm/px · 14 of 81 slices shown]
[im 1/81]
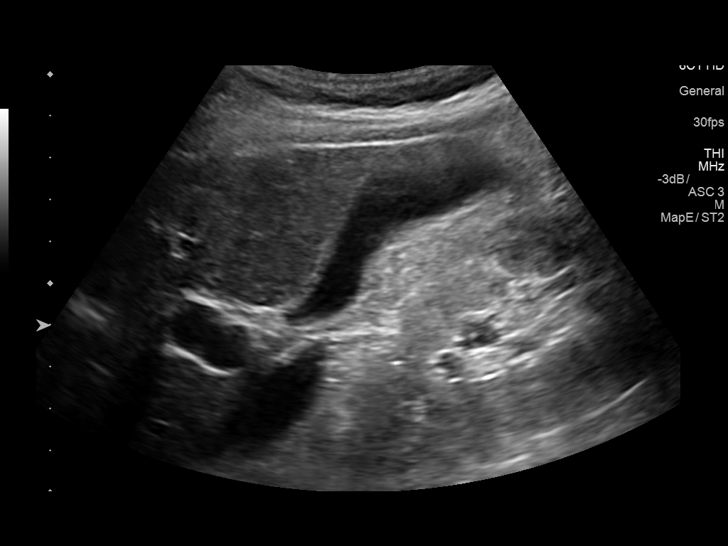
[im 7/81]
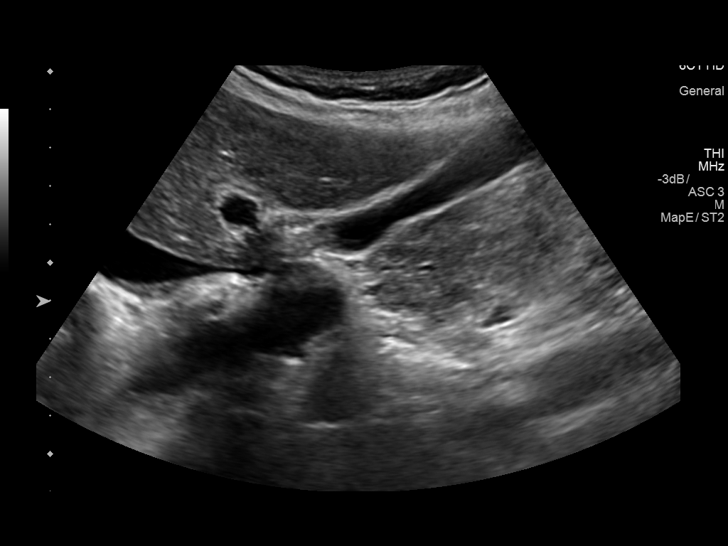
[im 14/81]
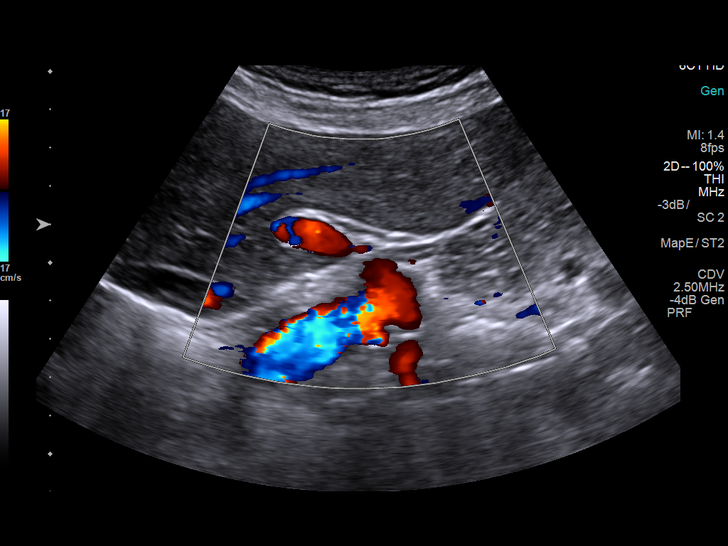
[im 21/81]
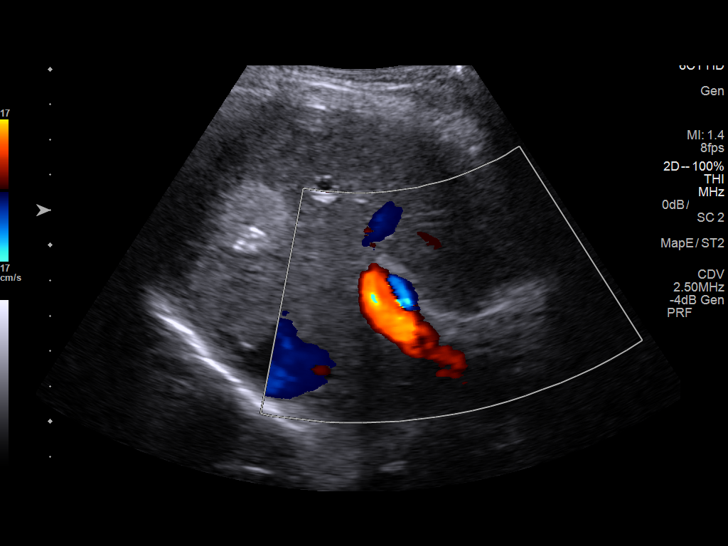
[im 27/81]
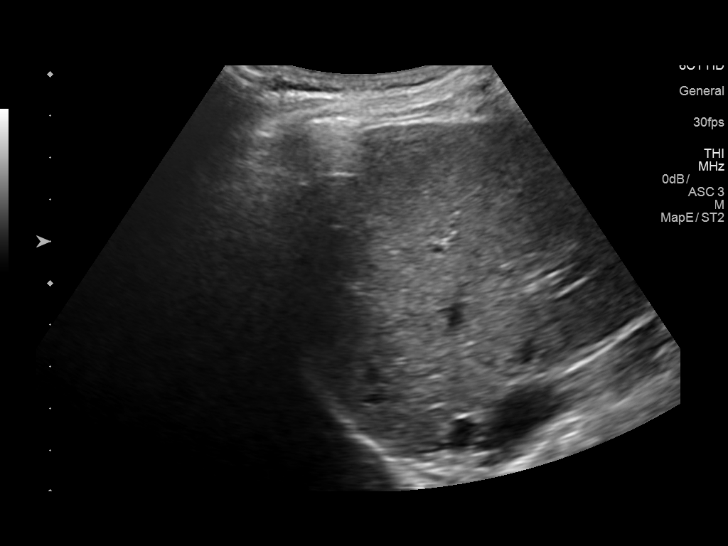
[im 31/81]
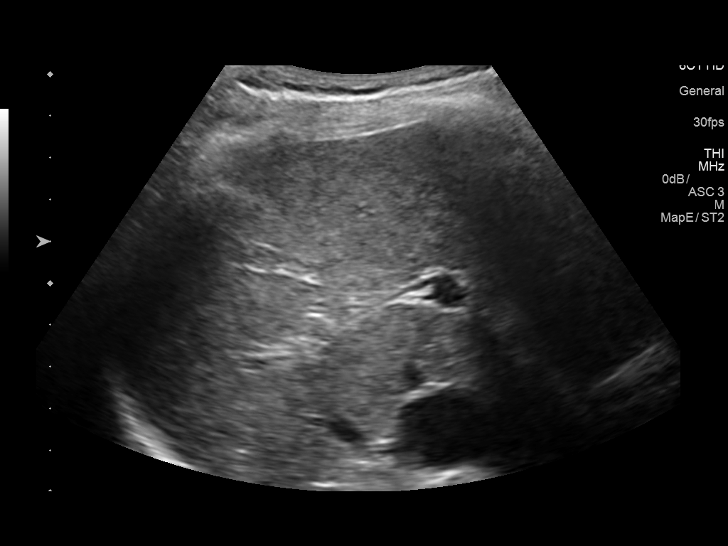
[im 37/81]
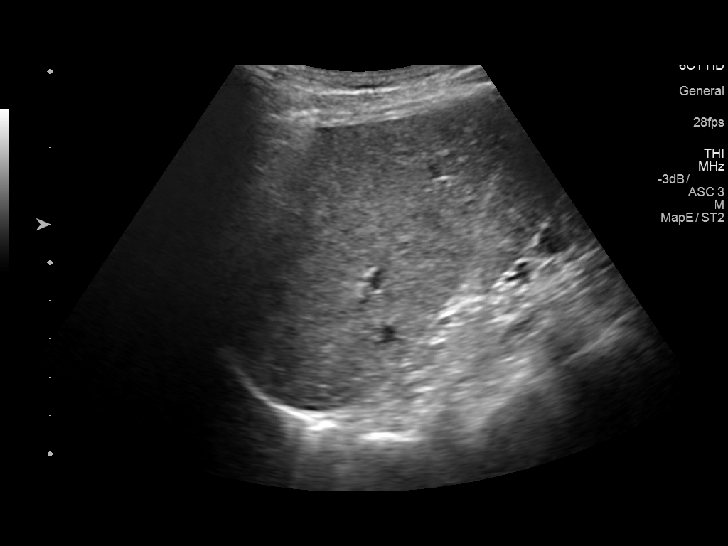
[im 44/81]
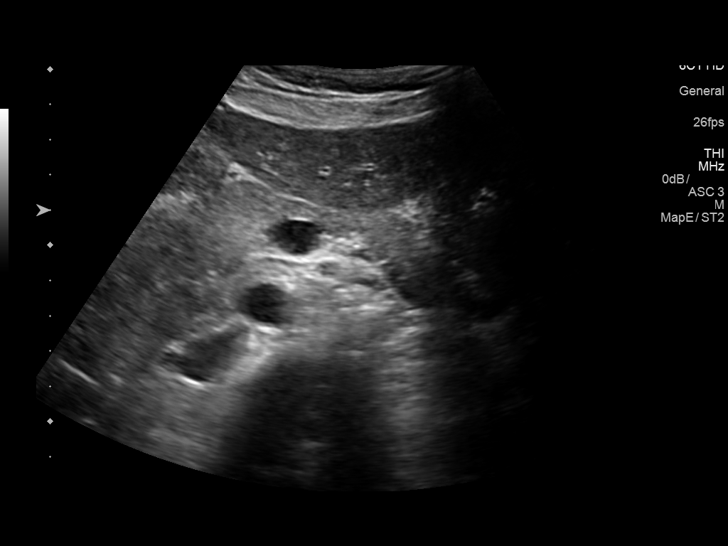
[im 51/81]
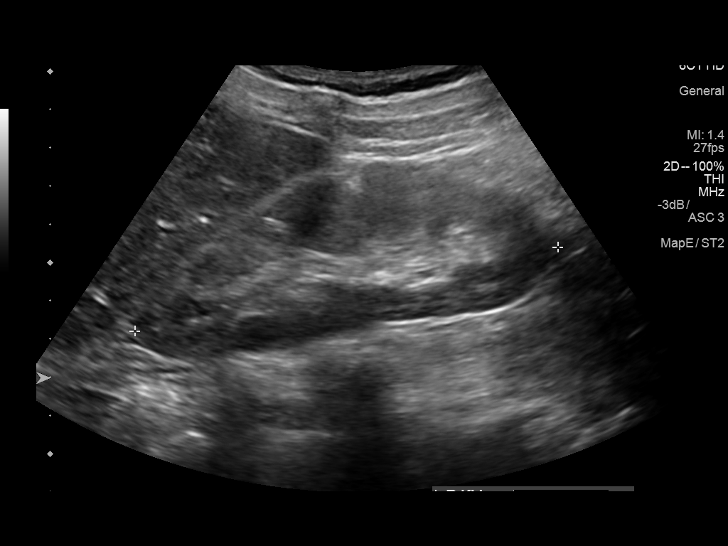
[im 54/81]
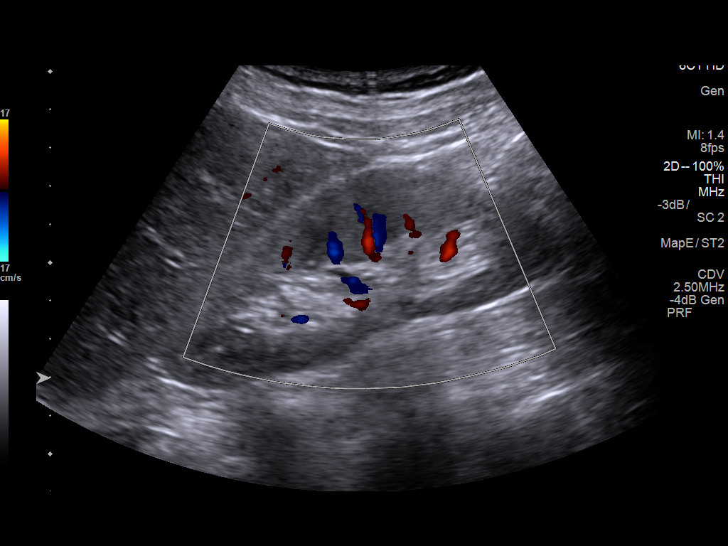
[im 61/81]
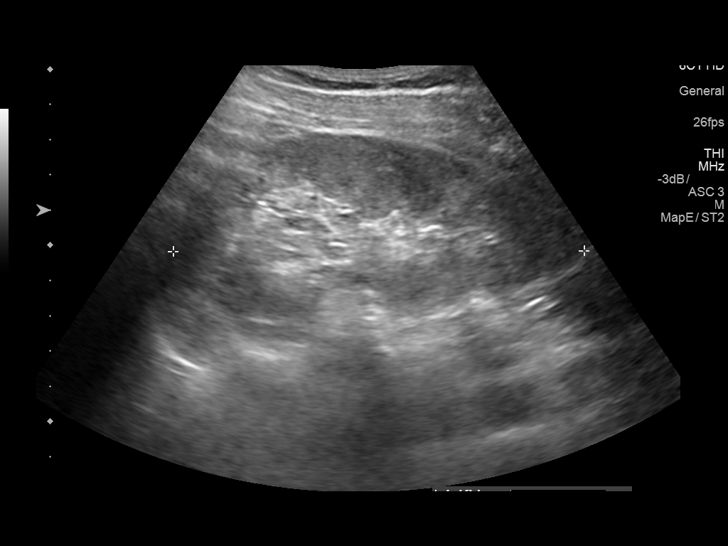
[im 67/81]
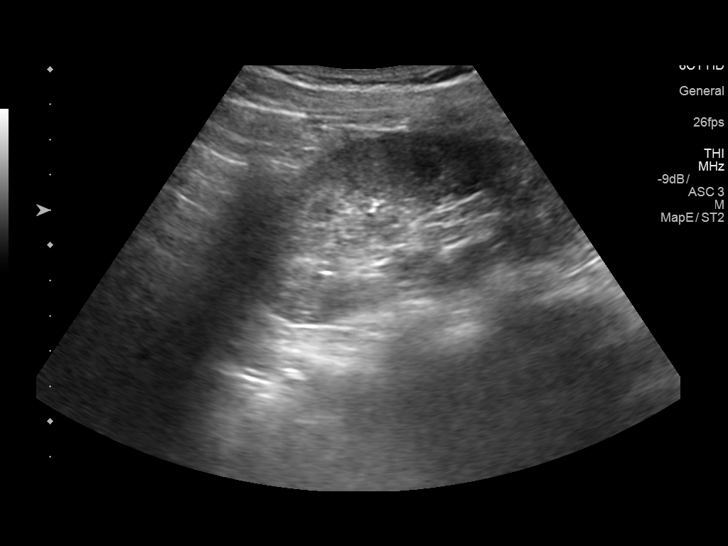
[im 74/81]
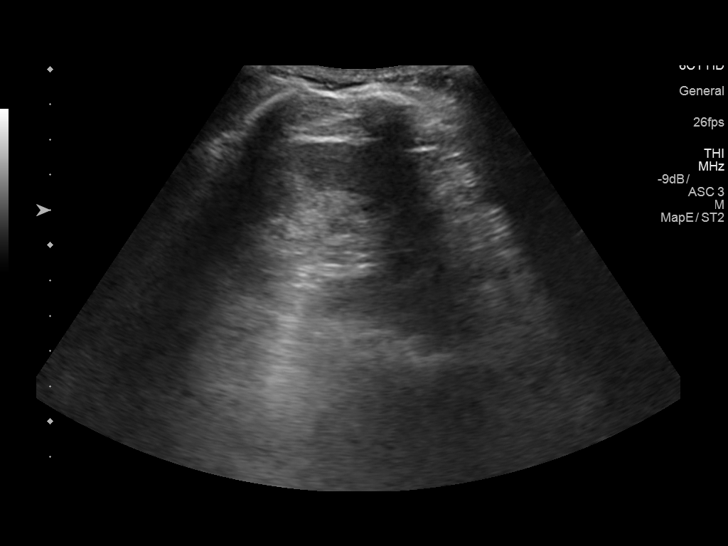
[im 81/81]
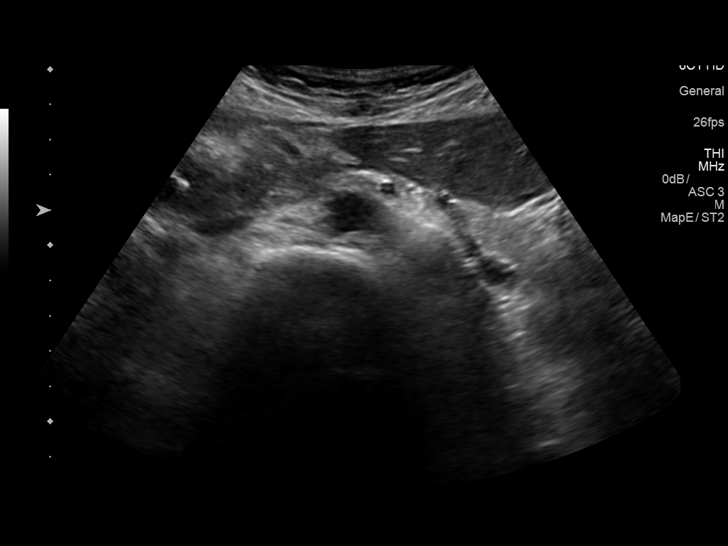

[14 of 25 positions shown; findings below may reference images not displayed]

FINDINGS: Gallbladder: No gallstones or wall thickening visualized. No
sonographic Murphy sign noted by sonographer.

Common bile duct: Diameter: 1.6 mm

Liver: There is a 2.5 x 2.1 x 2.5 cm solid hyperechoic nodule in the
right lobe liver. Diffuse increased echotexture throughout the liver
is identified. Portal vein is patent on color Doppler imaging with
normal direction of blood flow towards the liver.

IVC: No abnormality visualized.

Pancreas: Visualized portion unremarkable.

Spleen: Size and appearance within normal limits.

Right Kidney: Length: 11.3 cm. Echogenicity within normal limits. No
mass or hydronephrosis visualized.

Left Kidney: Length: 11.7 cm. Echogenicity within normal limits. No
mass or hydronephrosis visualized.

Abdominal aorta: No aneurysm visualized.

Other findings: None.
IMPRESSION: Stable 2.5 cm mass in the right lobe liver unchanged compared prior
exams dating back to [DATE].

No acute abnormality identified.

## 2020-07-09 DIAGNOSIS — Z23 Encounter for immunization: Secondary | ICD-10-CM | POA: Diagnosis not present

## 2020-07-09 DIAGNOSIS — S80812A Abrasion, left lower leg, initial encounter: Secondary | ICD-10-CM | POA: Diagnosis not present

## 2020-08-29 DIAGNOSIS — M810 Age-related osteoporosis without current pathological fracture: Secondary | ICD-10-CM | POA: Diagnosis not present

## 2020-08-29 DIAGNOSIS — Z79899 Other long term (current) drug therapy: Secondary | ICD-10-CM | POA: Diagnosis not present

## 2020-08-29 DIAGNOSIS — Z1331 Encounter for screening for depression: Secondary | ICD-10-CM | POA: Diagnosis not present

## 2020-08-29 DIAGNOSIS — I517 Cardiomegaly: Secondary | ICD-10-CM | POA: Diagnosis not present

## 2020-08-29 DIAGNOSIS — Z0000 Encounter for general adult medical examination without abnormal findings: Secondary | ICD-10-CM | POA: Diagnosis not present

## 2020-08-29 DIAGNOSIS — K219 Gastro-esophageal reflux disease without esophagitis: Secondary | ICD-10-CM | POA: Diagnosis not present

## 2020-08-29 DIAGNOSIS — R269 Unspecified abnormalities of gait and mobility: Secondary | ICD-10-CM | POA: Diagnosis not present

## 2020-08-29 DIAGNOSIS — K5900 Constipation, unspecified: Secondary | ICD-10-CM | POA: Diagnosis not present

## 2020-08-29 DIAGNOSIS — R82998 Other abnormal findings in urine: Secondary | ICD-10-CM | POA: Diagnosis not present

## 2020-08-29 DIAGNOSIS — G35 Multiple sclerosis: Secondary | ICD-10-CM | POA: Diagnosis not present

## 2020-08-29 DIAGNOSIS — R059 Cough, unspecified: Secondary | ICD-10-CM | POA: Diagnosis not present

## 2020-08-29 DIAGNOSIS — Z1389 Encounter for screening for other disorder: Secondary | ICD-10-CM | POA: Diagnosis not present

## 2020-08-29 DIAGNOSIS — D899 Disorder involving the immune mechanism, unspecified: Secondary | ICD-10-CM | POA: Diagnosis not present

## 2020-08-29 DIAGNOSIS — D72819 Decreased white blood cell count, unspecified: Secondary | ICD-10-CM | POA: Diagnosis not present

## 2020-08-29 DIAGNOSIS — D649 Anemia, unspecified: Secondary | ICD-10-CM | POA: Diagnosis not present

## 2020-08-29 DIAGNOSIS — M6281 Muscle weakness (generalized): Secondary | ICD-10-CM | POA: Diagnosis not present

## 2020-09-30 DIAGNOSIS — Z86010 Personal history of colonic polyps: Secondary | ICD-10-CM | POA: Diagnosis not present

## 2020-09-30 DIAGNOSIS — K219 Gastro-esophageal reflux disease without esophagitis: Secondary | ICD-10-CM | POA: Diagnosis not present

## 2020-09-30 DIAGNOSIS — R059 Cough, unspecified: Secondary | ICD-10-CM | POA: Diagnosis not present

## 2021-01-29 DIAGNOSIS — F319 Bipolar disorder, unspecified: Secondary | ICD-10-CM | POA: Diagnosis not present

## 2021-01-29 DIAGNOSIS — G629 Polyneuropathy, unspecified: Secondary | ICD-10-CM | POA: Diagnosis not present

## 2021-01-29 DIAGNOSIS — R030 Elevated blood-pressure reading, without diagnosis of hypertension: Secondary | ICD-10-CM | POA: Diagnosis not present

## 2021-01-29 DIAGNOSIS — K219 Gastro-esophageal reflux disease without esophagitis: Secondary | ICD-10-CM | POA: Diagnosis not present

## 2021-01-29 DIAGNOSIS — F419 Anxiety disorder, unspecified: Secondary | ICD-10-CM | POA: Diagnosis not present

## 2021-01-29 DIAGNOSIS — M1990 Unspecified osteoarthritis, unspecified site: Secondary | ICD-10-CM | POA: Diagnosis not present

## 2021-01-29 DIAGNOSIS — G35 Multiple sclerosis: Secondary | ICD-10-CM | POA: Diagnosis not present

## 2021-01-29 DIAGNOSIS — G8929 Other chronic pain: Secondary | ICD-10-CM | POA: Diagnosis not present

## 2021-01-29 DIAGNOSIS — K5900 Constipation, unspecified: Secondary | ICD-10-CM | POA: Diagnosis not present

## 2021-03-30 DIAGNOSIS — R413 Other amnesia: Secondary | ICD-10-CM | POA: Diagnosis not present

## 2021-03-30 DIAGNOSIS — Z1159 Encounter for screening for other viral diseases: Secondary | ICD-10-CM | POA: Diagnosis not present

## 2021-03-30 DIAGNOSIS — R002 Palpitations: Secondary | ICD-10-CM | POA: Diagnosis not present

## 2021-03-30 DIAGNOSIS — F5101 Primary insomnia: Secondary | ICD-10-CM | POA: Diagnosis not present

## 2021-03-30 DIAGNOSIS — G35 Multiple sclerosis: Secondary | ICD-10-CM | POA: Diagnosis not present

## 2021-03-30 DIAGNOSIS — R2689 Other abnormalities of gait and mobility: Secondary | ICD-10-CM | POA: Diagnosis not present

## 2021-03-30 DIAGNOSIS — M810 Age-related osteoporosis without current pathological fracture: Secondary | ICD-10-CM | POA: Diagnosis not present

## 2021-04-01 DIAGNOSIS — M6281 Muscle weakness (generalized): Secondary | ICD-10-CM | POA: Diagnosis not present

## 2021-04-01 DIAGNOSIS — R2681 Unsteadiness on feet: Secondary | ICD-10-CM | POA: Diagnosis not present

## 2021-04-01 DIAGNOSIS — R2689 Other abnormalities of gait and mobility: Secondary | ICD-10-CM | POA: Diagnosis not present

## 2021-04-09 DIAGNOSIS — M6281 Muscle weakness (generalized): Secondary | ICD-10-CM | POA: Diagnosis not present

## 2021-04-09 DIAGNOSIS — R2681 Unsteadiness on feet: Secondary | ICD-10-CM | POA: Diagnosis not present

## 2021-04-09 DIAGNOSIS — R2689 Other abnormalities of gait and mobility: Secondary | ICD-10-CM | POA: Diagnosis not present

## 2021-04-14 DIAGNOSIS — M6281 Muscle weakness (generalized): Secondary | ICD-10-CM | POA: Diagnosis not present

## 2021-04-14 DIAGNOSIS — R2681 Unsteadiness on feet: Secondary | ICD-10-CM | POA: Diagnosis not present

## 2021-04-14 DIAGNOSIS — R2689 Other abnormalities of gait and mobility: Secondary | ICD-10-CM | POA: Diagnosis not present

## 2021-04-15 DIAGNOSIS — B9689 Other specified bacterial agents as the cause of diseases classified elsewhere: Secondary | ICD-10-CM | POA: Diagnosis not present

## 2021-04-15 DIAGNOSIS — J069 Acute upper respiratory infection, unspecified: Secondary | ICD-10-CM | POA: Diagnosis not present

## 2021-04-15 DIAGNOSIS — J019 Acute sinusitis, unspecified: Secondary | ICD-10-CM | POA: Diagnosis not present

## 2021-04-15 DIAGNOSIS — R053 Chronic cough: Secondary | ICD-10-CM | POA: Diagnosis not present

## 2021-04-16 DIAGNOSIS — R2681 Unsteadiness on feet: Secondary | ICD-10-CM | POA: Diagnosis not present

## 2021-04-16 DIAGNOSIS — R2689 Other abnormalities of gait and mobility: Secondary | ICD-10-CM | POA: Diagnosis not present

## 2021-04-16 DIAGNOSIS — M6281 Muscle weakness (generalized): Secondary | ICD-10-CM | POA: Diagnosis not present

## 2021-04-20 DIAGNOSIS — R2681 Unsteadiness on feet: Secondary | ICD-10-CM | POA: Diagnosis not present

## 2021-04-20 DIAGNOSIS — M6281 Muscle weakness (generalized): Secondary | ICD-10-CM | POA: Diagnosis not present

## 2021-04-20 DIAGNOSIS — R2689 Other abnormalities of gait and mobility: Secondary | ICD-10-CM | POA: Diagnosis not present

## 2021-04-21 DIAGNOSIS — D899 Disorder involving the immune mechanism, unspecified: Secondary | ICD-10-CM | POA: Diagnosis not present

## 2021-04-21 DIAGNOSIS — F325 Major depressive disorder, single episode, in full remission: Secondary | ICD-10-CM | POA: Diagnosis not present

## 2021-04-21 DIAGNOSIS — M6281 Muscle weakness (generalized): Secondary | ICD-10-CM | POA: Diagnosis not present

## 2021-04-21 DIAGNOSIS — M810 Age-related osteoporosis without current pathological fracture: Secondary | ICD-10-CM | POA: Diagnosis not present

## 2021-04-21 DIAGNOSIS — R269 Unspecified abnormalities of gait and mobility: Secondary | ICD-10-CM | POA: Diagnosis not present

## 2021-04-21 DIAGNOSIS — G4700 Insomnia, unspecified: Secondary | ICD-10-CM | POA: Diagnosis not present

## 2021-04-21 DIAGNOSIS — D72819 Decreased white blood cell count, unspecified: Secondary | ICD-10-CM | POA: Diagnosis not present

## 2021-04-21 DIAGNOSIS — G35 Multiple sclerosis: Secondary | ICD-10-CM | POA: Diagnosis not present

## 2021-04-21 DIAGNOSIS — I517 Cardiomegaly: Secondary | ICD-10-CM | POA: Diagnosis not present

## 2021-04-22 DIAGNOSIS — R2681 Unsteadiness on feet: Secondary | ICD-10-CM | POA: Diagnosis not present

## 2021-04-22 DIAGNOSIS — M6281 Muscle weakness (generalized): Secondary | ICD-10-CM | POA: Diagnosis not present

## 2021-04-22 DIAGNOSIS — R2689 Other abnormalities of gait and mobility: Secondary | ICD-10-CM | POA: Diagnosis not present

## 2021-04-28 DIAGNOSIS — M6281 Muscle weakness (generalized): Secondary | ICD-10-CM | POA: Diagnosis not present

## 2021-04-28 DIAGNOSIS — R2689 Other abnormalities of gait and mobility: Secondary | ICD-10-CM | POA: Diagnosis not present

## 2021-04-28 DIAGNOSIS — R2681 Unsteadiness on feet: Secondary | ICD-10-CM | POA: Diagnosis not present

## 2021-04-30 DIAGNOSIS — R2689 Other abnormalities of gait and mobility: Secondary | ICD-10-CM | POA: Diagnosis not present

## 2021-04-30 DIAGNOSIS — R2681 Unsteadiness on feet: Secondary | ICD-10-CM | POA: Diagnosis not present

## 2021-04-30 DIAGNOSIS — M6281 Muscle weakness (generalized): Secondary | ICD-10-CM | POA: Diagnosis not present

## 2021-05-04 DIAGNOSIS — R2689 Other abnormalities of gait and mobility: Secondary | ICD-10-CM | POA: Diagnosis not present

## 2021-05-04 DIAGNOSIS — R2681 Unsteadiness on feet: Secondary | ICD-10-CM | POA: Diagnosis not present

## 2021-05-04 DIAGNOSIS — M6281 Muscle weakness (generalized): Secondary | ICD-10-CM | POA: Diagnosis not present

## 2021-05-14 ENCOUNTER — Other Ambulatory Visit (HOSPITAL_COMMUNITY): Payer: Self-pay

## 2021-05-15 ENCOUNTER — Encounter (HOSPITAL_COMMUNITY): Payer: Self-pay

## 2021-05-15 ENCOUNTER — Encounter (HOSPITAL_COMMUNITY): Payer: Medicare PPO

## 2021-05-28 DIAGNOSIS — Z961 Presence of intraocular lens: Secondary | ICD-10-CM | POA: Diagnosis not present

## 2021-05-28 DIAGNOSIS — H43811 Vitreous degeneration, right eye: Secondary | ICD-10-CM | POA: Diagnosis not present

## 2021-05-29 DIAGNOSIS — H26492 Other secondary cataract, left eye: Secondary | ICD-10-CM | POA: Diagnosis not present

## 2021-05-29 DIAGNOSIS — H43811 Vitreous degeneration, right eye: Secondary | ICD-10-CM | POA: Diagnosis not present

## 2021-05-29 DIAGNOSIS — Z961 Presence of intraocular lens: Secondary | ICD-10-CM | POA: Diagnosis not present

## 2021-09-29 DIAGNOSIS — F5101 Primary insomnia: Secondary | ICD-10-CM | POA: Diagnosis not present

## 2021-09-29 DIAGNOSIS — R600 Localized edema: Secondary | ICD-10-CM | POA: Diagnosis not present

## 2021-09-29 DIAGNOSIS — K219 Gastro-esophageal reflux disease without esophagitis: Secondary | ICD-10-CM | POA: Diagnosis not present

## 2021-09-29 DIAGNOSIS — R079 Chest pain, unspecified: Secondary | ICD-10-CM | POA: Diagnosis not present

## 2021-09-29 DIAGNOSIS — F419 Anxiety disorder, unspecified: Secondary | ICD-10-CM | POA: Diagnosis not present

## 2021-09-29 DIAGNOSIS — Z79899 Other long term (current) drug therapy: Secondary | ICD-10-CM | POA: Diagnosis not present

## 2021-09-29 DIAGNOSIS — G35 Multiple sclerosis: Secondary | ICD-10-CM | POA: Diagnosis not present

## 2021-09-29 DIAGNOSIS — M810 Age-related osteoporosis without current pathological fracture: Secondary | ICD-10-CM | POA: Diagnosis not present

## 2021-09-29 DIAGNOSIS — R0789 Other chest pain: Secondary | ICD-10-CM | POA: Diagnosis not present

## 2021-09-29 DIAGNOSIS — Z880 Allergy status to penicillin: Secondary | ICD-10-CM | POA: Diagnosis not present

## 2021-09-30 DIAGNOSIS — R0789 Other chest pain: Secondary | ICD-10-CM | POA: Diagnosis not present

## 2021-09-30 DIAGNOSIS — I081 Rheumatic disorders of both mitral and tricuspid valves: Secondary | ICD-10-CM | POA: Diagnosis not present

## 2021-11-16 DIAGNOSIS — R079 Chest pain, unspecified: Secondary | ICD-10-CM | POA: Diagnosis not present

## 2021-12-29 DIAGNOSIS — N3000 Acute cystitis without hematuria: Secondary | ICD-10-CM | POA: Diagnosis not present

## 2021-12-29 DIAGNOSIS — J029 Acute pharyngitis, unspecified: Secondary | ICD-10-CM | POA: Diagnosis not present

## 2021-12-29 DIAGNOSIS — B351 Tinea unguium: Secondary | ICD-10-CM | POA: Diagnosis not present

## 2022-04-12 DIAGNOSIS — R051 Acute cough: Secondary | ICD-10-CM | POA: Diagnosis not present

## 2022-04-12 DIAGNOSIS — J22 Unspecified acute lower respiratory infection: Secondary | ICD-10-CM | POA: Diagnosis not present

## 2022-04-12 DIAGNOSIS — J019 Acute sinusitis, unspecified: Secondary | ICD-10-CM | POA: Diagnosis not present

## 2022-04-27 DIAGNOSIS — K219 Gastro-esophageal reflux disease without esophagitis: Secondary | ICD-10-CM | POA: Diagnosis not present

## 2022-04-27 DIAGNOSIS — R059 Cough, unspecified: Secondary | ICD-10-CM | POA: Diagnosis not present

## 2022-04-27 DIAGNOSIS — Z86010 Personal history of colonic polyps: Secondary | ICD-10-CM | POA: Diagnosis not present

## 2022-04-27 DIAGNOSIS — K649 Unspecified hemorrhoids: Secondary | ICD-10-CM | POA: Diagnosis not present

## 2022-07-26 DIAGNOSIS — F5101 Primary insomnia: Secondary | ICD-10-CM | POA: Diagnosis not present

## 2022-07-26 DIAGNOSIS — E782 Mixed hyperlipidemia: Secondary | ICD-10-CM | POA: Diagnosis not present

## 2022-07-26 DIAGNOSIS — G35 Multiple sclerosis: Secondary | ICD-10-CM | POA: Diagnosis not present

## 2022-07-26 DIAGNOSIS — M810 Age-related osteoporosis without current pathological fracture: Secondary | ICD-10-CM | POA: Diagnosis not present

## 2022-07-26 DIAGNOSIS — Z7189 Other specified counseling: Secondary | ICD-10-CM | POA: Diagnosis not present

## 2022-07-26 DIAGNOSIS — Z79899 Other long term (current) drug therapy: Secondary | ICD-10-CM | POA: Diagnosis not present

## 2022-07-26 DIAGNOSIS — Z0000 Encounter for general adult medical examination without abnormal findings: Secondary | ICD-10-CM | POA: Diagnosis not present

## 2022-07-26 DIAGNOSIS — K219 Gastro-esophageal reflux disease without esophagitis: Secondary | ICD-10-CM | POA: Diagnosis not present

## 2022-07-26 DIAGNOSIS — Z1231 Encounter for screening mammogram for malignant neoplasm of breast: Secondary | ICD-10-CM | POA: Diagnosis not present

## 2022-07-29 DIAGNOSIS — G35 Multiple sclerosis: Secondary | ICD-10-CM | POA: Diagnosis not present

## 2022-08-18 DIAGNOSIS — Z1231 Encounter for screening mammogram for malignant neoplasm of breast: Secondary | ICD-10-CM | POA: Diagnosis not present

## 2022-08-18 DIAGNOSIS — M810 Age-related osteoporosis without current pathological fracture: Secondary | ICD-10-CM | POA: Diagnosis not present

## 2022-08-26 DIAGNOSIS — L820 Inflamed seborrheic keratosis: Secondary | ICD-10-CM | POA: Diagnosis not present

## 2022-08-26 DIAGNOSIS — L538 Other specified erythematous conditions: Secondary | ICD-10-CM | POA: Diagnosis not present

## 2022-08-26 DIAGNOSIS — B351 Tinea unguium: Secondary | ICD-10-CM | POA: Diagnosis not present

## 2022-10-25 DIAGNOSIS — R300 Dysuria: Secondary | ICD-10-CM | POA: Diagnosis not present

## 2022-10-25 DIAGNOSIS — N390 Urinary tract infection, site not specified: Secondary | ICD-10-CM | POA: Diagnosis not present

## 2022-11-23 DIAGNOSIS — M6281 Muscle weakness (generalized): Secondary | ICD-10-CM | POA: Diagnosis not present

## 2022-11-23 DIAGNOSIS — R262 Difficulty in walking, not elsewhere classified: Secondary | ICD-10-CM | POA: Diagnosis not present

## 2022-11-30 DIAGNOSIS — R262 Difficulty in walking, not elsewhere classified: Secondary | ICD-10-CM | POA: Diagnosis not present

## 2022-11-30 DIAGNOSIS — M6281 Muscle weakness (generalized): Secondary | ICD-10-CM | POA: Diagnosis not present

## 2022-12-06 DIAGNOSIS — M6281 Muscle weakness (generalized): Secondary | ICD-10-CM | POA: Diagnosis not present

## 2022-12-06 DIAGNOSIS — R262 Difficulty in walking, not elsewhere classified: Secondary | ICD-10-CM | POA: Diagnosis not present

## 2022-12-08 DIAGNOSIS — R262 Difficulty in walking, not elsewhere classified: Secondary | ICD-10-CM | POA: Diagnosis not present

## 2022-12-08 DIAGNOSIS — M6281 Muscle weakness (generalized): Secondary | ICD-10-CM | POA: Diagnosis not present

## 2022-12-13 DIAGNOSIS — R262 Difficulty in walking, not elsewhere classified: Secondary | ICD-10-CM | POA: Diagnosis not present

## 2022-12-13 DIAGNOSIS — M6281 Muscle weakness (generalized): Secondary | ICD-10-CM | POA: Diagnosis not present

## 2022-12-15 DIAGNOSIS — R262 Difficulty in walking, not elsewhere classified: Secondary | ICD-10-CM | POA: Diagnosis not present

## 2022-12-15 DIAGNOSIS — M6281 Muscle weakness (generalized): Secondary | ICD-10-CM | POA: Diagnosis not present

## 2022-12-20 DIAGNOSIS — M6281 Muscle weakness (generalized): Secondary | ICD-10-CM | POA: Diagnosis not present

## 2022-12-20 DIAGNOSIS — R262 Difficulty in walking, not elsewhere classified: Secondary | ICD-10-CM | POA: Diagnosis not present

## 2022-12-22 DIAGNOSIS — R262 Difficulty in walking, not elsewhere classified: Secondary | ICD-10-CM | POA: Diagnosis not present

## 2022-12-22 DIAGNOSIS — M6281 Muscle weakness (generalized): Secondary | ICD-10-CM | POA: Diagnosis not present

## 2022-12-27 DIAGNOSIS — M6281 Muscle weakness (generalized): Secondary | ICD-10-CM | POA: Diagnosis not present

## 2022-12-27 DIAGNOSIS — R262 Difficulty in walking, not elsewhere classified: Secondary | ICD-10-CM | POA: Diagnosis not present

## 2022-12-29 DIAGNOSIS — R262 Difficulty in walking, not elsewhere classified: Secondary | ICD-10-CM | POA: Diagnosis not present

## 2022-12-29 DIAGNOSIS — M6281 Muscle weakness (generalized): Secondary | ICD-10-CM | POA: Diagnosis not present

## 2023-01-03 DIAGNOSIS — M6281 Muscle weakness (generalized): Secondary | ICD-10-CM | POA: Diagnosis not present

## 2023-01-03 DIAGNOSIS — R262 Difficulty in walking, not elsewhere classified: Secondary | ICD-10-CM | POA: Diagnosis not present

## 2023-01-12 DIAGNOSIS — J0140 Acute pansinusitis, unspecified: Secondary | ICD-10-CM | POA: Diagnosis not present

## 2023-04-20 DIAGNOSIS — R059 Cough, unspecified: Secondary | ICD-10-CM | POA: Diagnosis not present

## 2023-04-20 DIAGNOSIS — K219 Gastro-esophageal reflux disease without esophagitis: Secondary | ICD-10-CM | POA: Diagnosis not present

## 2023-05-24 DIAGNOSIS — H9201 Otalgia, right ear: Secondary | ICD-10-CM | POA: Diagnosis not present

## 2023-05-24 DIAGNOSIS — R059 Cough, unspecified: Secondary | ICD-10-CM | POA: Diagnosis not present

## 2023-05-24 DIAGNOSIS — J209 Acute bronchitis, unspecified: Secondary | ICD-10-CM | POA: Diagnosis not present

## 2023-05-24 DIAGNOSIS — H60393 Other infective otitis externa, bilateral: Secondary | ICD-10-CM | POA: Diagnosis not present

## 2023-06-23 DIAGNOSIS — K225 Diverticulum of esophagus, acquired: Secondary | ICD-10-CM | POA: Diagnosis not present

## 2023-06-23 DIAGNOSIS — K449 Diaphragmatic hernia without obstruction or gangrene: Secondary | ICD-10-CM | POA: Diagnosis not present

## 2023-06-28 DIAGNOSIS — R197 Diarrhea, unspecified: Secondary | ICD-10-CM | POA: Diagnosis not present

## 2023-06-29 DIAGNOSIS — R197 Diarrhea, unspecified: Secondary | ICD-10-CM | POA: Diagnosis not present

## 2023-07-13 DIAGNOSIS — K591 Functional diarrhea: Secondary | ICD-10-CM | POA: Diagnosis not present

## 2023-07-29 DIAGNOSIS — K591 Functional diarrhea: Secondary | ICD-10-CM | POA: Diagnosis not present

## 2023-11-22 DIAGNOSIS — R933 Abnormal findings on diagnostic imaging of other parts of digestive tract: Secondary | ICD-10-CM | POA: Diagnosis not present

## 2023-11-22 DIAGNOSIS — Z860100 Personal history of colon polyps, unspecified: Secondary | ICD-10-CM | POA: Diagnosis not present

## 2023-11-22 DIAGNOSIS — R197 Diarrhea, unspecified: Secondary | ICD-10-CM | POA: Diagnosis not present

## 2023-12-27 DIAGNOSIS — R933 Abnormal findings on diagnostic imaging of other parts of digestive tract: Secondary | ICD-10-CM | POA: Diagnosis not present

## 2023-12-27 DIAGNOSIS — R197 Diarrhea, unspecified: Secondary | ICD-10-CM | POA: Diagnosis not present

## 2023-12-27 DIAGNOSIS — Z860100 Personal history of colon polyps, unspecified: Secondary | ICD-10-CM | POA: Diagnosis not present

## 2023-12-27 DIAGNOSIS — K219 Gastro-esophageal reflux disease without esophagitis: Secondary | ICD-10-CM | POA: Diagnosis not present

## 2024-01-02 DIAGNOSIS — R053 Chronic cough: Secondary | ICD-10-CM | POA: Diagnosis not present

## 2024-01-02 DIAGNOSIS — K317 Polyp of stomach and duodenum: Secondary | ICD-10-CM | POA: Diagnosis not present

## 2024-01-02 DIAGNOSIS — K2281 Esophageal polyp: Secondary | ICD-10-CM | POA: Diagnosis not present

## 2024-01-02 DIAGNOSIS — K219 Gastro-esophageal reflux disease without esophagitis: Secondary | ICD-10-CM | POA: Diagnosis not present

## 2024-01-02 DIAGNOSIS — K2970 Gastritis, unspecified, without bleeding: Secondary | ICD-10-CM | POA: Diagnosis not present

## 2024-01-02 DIAGNOSIS — Q396 Congenital diverticulum of esophagus: Secondary | ICD-10-CM | POA: Diagnosis not present
